# Patient Record
Sex: Male | Born: 1985 | Race: White | Hispanic: No | Marital: Married | Smoking: Never smoker
Health system: Southern US, Community
[De-identification: ages and names within clinical notes are randomized; demographics above are authoritative.]

## PROBLEM LIST (undated history)

## (undated) DIAGNOSIS — N200 Calculus of kidney: Secondary | ICD-10-CM

---

## 2005-06-28 ENCOUNTER — Emergency Department (HOSPITAL_COMMUNITY): Admission: EM | Admit: 2005-06-28 | Discharge: 2005-06-28 | Payer: Self-pay | Admitting: Emergency Medicine

## 2008-05-10 ENCOUNTER — Emergency Department (HOSPITAL_COMMUNITY): Admission: EM | Admit: 2008-05-10 | Discharge: 2008-05-10 | Payer: Self-pay | Admitting: Emergency Medicine

## 2021-11-29 ENCOUNTER — Emergency Department (HOSPITAL_COMMUNITY)
Admission: EM | Admit: 2021-11-29 | Discharge: 2021-11-29 | Disposition: A | Discharge status: Home / Self Care | Payer: Medicaid Other | Attending: Emergency Medicine | Admitting: Emergency Medicine

## 2021-11-29 ENCOUNTER — Other Ambulatory Visit: Payer: Self-pay

## 2021-11-29 ENCOUNTER — Encounter (HOSPITAL_COMMUNITY): Payer: Self-pay | Admitting: *Deleted

## 2021-11-29 DIAGNOSIS — H1031 Unspecified acute conjunctivitis, right eye: Secondary | ICD-10-CM | POA: Insufficient documentation

## 2021-11-29 DIAGNOSIS — H5789 Other specified disorders of eye and adnexa: Secondary | ICD-10-CM | POA: Diagnosis present

## 2021-11-29 MED ORDER — ERYTHROMYCIN 5 MG/GM OP OINT: 1.0000 "application " | TOPICAL_OINTMENT | Freq: Four times a day (QID) | OPHTHALMIC | 0 refills | Status: AC

## 2021-11-29 NOTE — ED Provider Notes (Signed)
?[facility]GENCY DEPT ?[facility] ?Provider Note ?MRN:  [id number]  ?Arrival date & time: 11/29/21    ? ?Chief Complaint   ?Eye Drainage ?  ?History of Present Illness   ?Roger Anderson is a 36 y.o. year-old male with no pertinent past medical presenting to the ED with chief complaint of eye drainage. ? ?Right eye redness, green drainage, mild irritation for the past few days.  No vision loss, no other complaints. ? ?Review of Systems  ?A thorough review of systems was obtained and all systems are negative except as noted in the HPI and PMH.  ? ?Patient's Health History   ?History reviewed. No pertinent past medical history.  ?History reviewed. No pertinent surgical history.  ?History reviewed. No pertinent family history.  ?Social History  ? ?Socioeconomic History  ? Marital status: Single  ?  Spouse name: Not on file  ? Number of children: Not on file  ? Years of education: Not on file  ? Highest education level: Not on file  ?Occupational History  ? Not on file  ?Tobacco Use  ? Smoking status: Never  ? Smokeless tobacco: Never  ?Substance and Sexual Activity  ? Alcohol use: Yes  ? Drug use: Not Currently  ? Sexual activity: Not on file  ?Other Topics Concern  ? Not on file  ?Social History Narrative  ? Not on file  ? ?Social Determinants of Health  ? ?Financial Resource Strain: Not on file  ?Food Insecurity: Not on file  ?Transportation Needs: Not on file  ?Physical Activity: Not on file  ?Stress: Not on file  ?Social Connections: Not on file  ?Intimate Partner Violence: Not on file  ?  ? ?Physical Exam  ? ?Vitals:  ? 11/29/21 2045  ?BP: 135/84  ?Pulse: 70  ?Resp: 18  ?Temp: 97.7 ?F (36.5 ?C)  ?SpO2: 99%  ?  ?CONSTITUTIONAL: Well-appearing, NAD ?NEURO/PSYCH:  Alert and oriented x 3, no focal deficits ?EYES:  eyes equal and reactive, normal extraocular movements, right eye with moderate conjunctival erythema ?ENT/NECK:  no LAD, no JVD ?CARDIO: Regular rate, well-perfused, normal S1 and  S2 ?PULM:  CTAB no wheezing or rhonchi ?GI/GU:  non-distended, non-tender ?MSK/SPINE:  No gross deformities, no edema ?SKIN:  no rash, atraumatic ? ? ?*Additional and/or pertinent findings included in MDM below ? ?Diagnostic and Interventional Summary  ? ? EKG Interpretation ? ?Date/Time:    ?Ventricular Rate:    ?PR Interval:    ?QRS Duration:   ?QT Interval:    ?QTC Calculation:   ?R Axis:     ?Text Interpretation:   ?  ? ?  ? ?Labs Reviewed - No data to display  ?No orders to display  ?  ?Medications - No data to display  ? ?Procedures  /  Critical Care ?Procedures ? ?ED Course and Medical Decision Making  ?Initial Impression and Ddx ?Suspect conjunctivitis, possibly bacterial given the yellow-green discharge.  No significant pain, highly doubt glaucoma or corneal abrasion or ulcer.  No recent trauma, no contact lens use.  Appropriate for discharge on erythromycin. ? ?Past medical/surgical history that increases complexity of ED encounter: None ? ?Interpretation of Diagnostics ?Not applicable ? ?Patient Reassessment and Ultimate Disposition/Management ?Discharge home ? ?Patient management required discussion with the following services or consulting groups:  None ? ?Complexity of Problems Addressed ?Acute illness or injury that poses threat of life of bodily function ? ?Additional Data Reviewed and Analyzed ?Further history obtained from: ?Further history from spouse/family member ? ?  Additional Factors Impacting ED Encounter Risk ?Prescriptions ? ?Elmer Sow. Pilar Plate, MD ?[facility] Emergency Medicine ?[facility] [email or web address] ? ?Final Clinical Impressions(s) / ED Diagnoses  ? ?  ICD-10-CM   ?1. Acute conjunctivitis of right eye, unspecified acute conjunctivitis type  H10.31   ?  ?  ?ED Discharge Orders   ? ?      Ordered  ?  erythromycin ophthalmic ointment  4 times daily       ? 11/29/21 2329  ? ?  ?  ? ?  ?  ? ?Discharge Instructions Discussed with and Provided to Patient:  ? ? ?Discharge  Instructions   ? ?  ?You were evaluated in the Emergency Department and after careful evaluation, we did not find any emergent condition requiring admission or further testing in the hospital. ? ?Your exam/testing today is overall reassuring.  Symptoms seem to be due to pinkeye or conjunctivitis.  Please take the erythromycin as directed.  If symptoms do not improve over the next week, recommend follow-up with an ophthalmologist. ? ?Please return to the Emergency Department if you experience any worsening of your condition.   Thank you for allowing [place] to be a part of your care. ? ? ? ?  ?Sabas Sous, MD ?11/29/21 2333 ? ?

## 2021-11-29 NOTE — ED Triage Notes (Signed)
Pt with redness to right eye with drainage that started this afternoon.  ?

## 2021-11-29 NOTE — Discharge Instructions (Signed)
You were evaluated in the Emergency Department and after careful evaluation, we did not find any emergent condition requiring admission or further testing in the hospital. ? ?Your exam/testing today is overall reassuring.  Symptoms seem to be due to pinkeye or conjunctivitis.  Please take the erythromycin as directed.  If symptoms do not improve over the next week, recommend follow-up with an ophthalmologist. ? ?Please return to the Emergency Department if you experience any worsening of your condition.   Thank you for allowing [place] to be a part of your care. ?

## 2022-01-08 ENCOUNTER — Emergency Department (HOSPITAL_COMMUNITY): Payer: Medicaid Other

## 2022-01-08 ENCOUNTER — Other Ambulatory Visit: Payer: Self-pay

## 2022-01-08 ENCOUNTER — Encounter (HOSPITAL_COMMUNITY): Payer: Self-pay

## 2022-01-08 ENCOUNTER — Emergency Department (HOSPITAL_COMMUNITY)
Admission: EM | Admit: 2022-01-08 | Discharge: 2022-01-08 | Disposition: A | Discharge status: Home / Self Care | Payer: Medicaid Other | Attending: Emergency Medicine | Admitting: Emergency Medicine

## 2022-01-08 DIAGNOSIS — W310XXA Contact with mining and earth-drilling machinery, initial encounter: Secondary | ICD-10-CM | POA: Insufficient documentation

## 2022-01-08 DIAGNOSIS — S65402A Unspecified injury of blood vessel of left thumb, initial encounter: Secondary | ICD-10-CM | POA: Diagnosis present

## 2022-01-08 DIAGNOSIS — S61012A Laceration without foreign body of left thumb without damage to nail, initial encounter: Secondary | ICD-10-CM | POA: Insufficient documentation

## 2022-01-08 DIAGNOSIS — Z23 Encounter for immunization: Secondary | ICD-10-CM | POA: Diagnosis not present

## 2022-01-08 MED ORDER — TETANUS-DIPHTH-ACELL PERTUSSIS 5-2.5-18.5 LF-MCG/0.5 IM SUSY
0.5000 mL | PREFILLED_SYRINGE | Freq: Once | INTRAMUSCULAR | Status: AC
  Administered 2022-01-08: 0.5 mL via INTRAMUSCULAR
  Filled 2022-01-08: qty 0.5

## 2022-01-08 MED ORDER — CEPHALEXIN 500 MG PO CAPS: 500.0000 mg | ORAL_CAPSULE | Freq: Four times a day (QID) | ORAL | 0 refills | Status: DC

## 2022-01-08 NOTE — ED Triage Notes (Signed)
Po vfrom home. Cc of left thumb lac 5 days ago from a box cutter. Says it hurts.  ?

## 2022-01-08 NOTE — Discharge Instructions (Signed)
Take Tylenol as you have been.  Take it in your milligrams of ibuprofen 3 times daily with food and water.  Take Keflex 4 times daily for the next 5 days to cover for any infection.  Your tetanus was updated today, this will be good for 10 years or 7 if you injure yourself again.  See your primary next week for wound recheck. ?

## 2022-01-08 NOTE — ED Provider Notes (Signed)
?[facility] EMERGENCY DEPARTMENT ?Provider Note ? ? ?CSN: [id number] ?Arrival date & time: 01/08/22  1752 ? ?  ? ?History ? ?Chief Complaint  ?Patient presents with  ? Laceration  ? ? ?Roger Anderson is a 36 y.o. male. ? ? ?Laceration ? ? Patient presents with laceration to left thumb that happened 5 days ago.  He was not seen at that time because he did not want to go to the hospital.  This tetanus is not up-to-date.  He has been having pain to the site of the laceration since the incident, has been getting worse with pressure and using his hand.  He is right-hand dominant. ? ?Home Medications ?Prior to Admission medications   ?Medication Sig Start Date End Date Taking? Authorizing Provider  ?cephALEXin (KEFLEX) 500 MG capsule Take 1 capsule (500 mg total) by mouth 4 (four) times daily. 01/08/22  Yes Theron Arista, PA-C  ?   ? ?Allergies    ?Patient has no known allergies.   ? ?Review of Systems   ?Review of Systems ? ?Physical Exam ?Updated Vital Signs ?BP 130/83   Pulse 68   Temp 97.7 ?F (36.5 ?C) (Oral)   Resp 15   Ht 5\' 10"  (1.778 m)   Wt 117 kg   SpO2 99%   BMI 37.01 kg/m?  ?Physical Exam ?Vitals and nursing note reviewed. Exam conducted with a chaperone present.  ?Constitutional:   ?   General: He is not in acute distress. ?   Appearance: Normal appearance.  ?HENT:  ?   Head: Normocephalic and atraumatic.  ?Eyes:  ?   General: No scleral icterus. ?   Extraocular Movements: Extraocular movements intact.  ?   Pupils: Pupils are equal, round, and reactive to light.  ?Cardiovascular:  ?   Pulses: Normal pulses.  ?Musculoskeletal:     ?   General: Normal range of motion.  ?   Comments: Thumb abduction adduction intact.  Able to make a fist.  ?Skin: ?   Capillary Refill: Capillary refill takes less than 2 seconds.  ?   Coloration: Skin is not jaundiced.  ?Neurological:  ?   Mental Status: He is alert. Mental status is at baseline.  ?   Coordination: Coordination normal.  ? ? ? ?ED Results / Procedures /  Treatments   ?Labs ?(all labs ordered are listed, but only abnormal results are displayed) ?Labs Reviewed - No data to display ? ?EKG ?None ? ?Radiology ?DG Finger Thumb Left ? ?Result Date: 01/08/2022 ?CLINICAL DATA:  Left thumb pain EXAM: LEFT THUMB 2+V COMPARISON:  None Available. FINDINGS: There is no evidence of fracture or dislocation. There is no evidence of arthropathy or other focal bone abnormality. Soft tissues are unremarkable. IMPRESSION: Negative. Electronically Signed   By: 03/10/2022 M.D.   On: 01/08/2022 21:19   ? ?Procedures ?Procedures  ? ? ?Medications Ordered in ED ?Medications  ?Tdap (BOOSTRIX) injection 0.5 mL (0.5 mLs Intramuscular Given 01/08/22 2142)  ? ? ?ED Course/ Medical Decision Making/ A&P ?  ?                        ?Medical Decision Making ?Amount and/or Complexity of Data Reviewed ?Radiology: ordered. ? ?Risk ?Prescription drug management. ? ? ?Patient presents due to laceration to left thumb.  He is neurovascular intact with brisk cap refill, radial pulse 2+ and full ROM to the thumb.  No appreciable fluctuance that would be amenable to I&D.  X-ray  obtained to evaluate for foreign bodies given mechanism, no foreign bodies noted.  Given contamination I do have some concern for developing infection, will cover with Keflex.  Tetanus was updated today as well.  Discussed with patient that when she long to close the sutures they verbalized understanding.  Patient was discharged in stable condition.  He will follow-up with his primary next week for wound recheck. ? ? ? ? ? ? ? ?Final Clinical Impression(s) / ED Diagnoses ?Final diagnoses:  ?Laceration of left thumb without foreign body without damage to nail, initial encounter  ? ? ?Rx / DC Orders ?ED Discharge Orders   ? ?      Ordered  ?  cephALEXin (KEFLEX) 500 MG capsule  4 times daily       ? 01/08/22 2204  ? ?  ?  ? ?  ? ? ?  ?Theron Arista, PA-C ?01/08/22 2207 ? ?  ?Bethann Berkshire, MD ?01/09/22 1135 ? ?

## 2022-04-07 ENCOUNTER — Emergency Department (HOSPITAL_BASED_OUTPATIENT_CLINIC_OR_DEPARTMENT_OTHER)
Admission: EM | Admit: 2022-04-07 | Discharge: 2022-04-07 | Disposition: A | Discharge status: Home / Self Care | Payer: Medicaid Other | Attending: Emergency Medicine | Admitting: Emergency Medicine

## 2022-04-07 ENCOUNTER — Other Ambulatory Visit: Payer: Self-pay

## 2022-04-07 DIAGNOSIS — W278XXA Contact with other nonpowered hand tool, initial encounter: Secondary | ICD-10-CM | POA: Diagnosis not present

## 2022-04-07 DIAGNOSIS — S59912A Unspecified injury of left forearm, initial encounter: Secondary | ICD-10-CM | POA: Diagnosis present

## 2022-04-07 DIAGNOSIS — S51812A Laceration without foreign body of left forearm, initial encounter: Secondary | ICD-10-CM | POA: Diagnosis not present

## 2022-04-07 DIAGNOSIS — Y990 Civilian activity done for income or pay: Secondary | ICD-10-CM | POA: Insufficient documentation

## 2022-04-07 MED ORDER — CEPHALEXIN 500 MG PO CAPS: 500.0000 mg | ORAL_CAPSULE | Freq: Three times a day (TID) | ORAL | 0 refills | Status: AC

## 2022-04-07 MED ORDER — LIDOCAINE HCL (PF) 1 % IJ SOLN
5.0000 mL | Freq: Once | INTRAMUSCULAR | Status: AC
  Administered 2022-04-07: 5 mL

## 2022-04-07 MED ORDER — LIDOCAINE HCL (PF) 1 % IJ SOLN
INTRAMUSCULAR | Status: AC
  Filled 2022-04-07: qty 5

## 2022-04-07 NOTE — Discharge Instructions (Signed)
You were evaluated in the Emergency Department and after careful evaluation, we did not find any emergent condition requiring admission or further testing in the hospital.  Your exam/testing today was overall reassuring.  We repaired your laceration with a combination of sutures and staples.  These will need to be removed in 10 to 14 days by healthcare professional.  Please return to the Emergency Department if you experience any worsening of your condition.  Thank you for allowing [place] to be a part of your care.

## 2022-04-07 NOTE — ED Provider Notes (Signed)
[facility] EMERGENCY [facility] Emergency Department Provider Note MRN:  [id number]  Arrival date & time: 04/07/22     Chief Complaint   Extremity Laceration   History of Present Illness   Roger Anderson is a 36 y.o. year-old male with no pertinent past medical history presenting to the ED with chief complaint of extremity laceration.  Patient works at a [employer] and was cutting paper with a box cutter and the knife slipped.  Sustained laceration to the left anterior forearm.  Some active bleeding.  No other injuries.  Tetanus up-to-date.  Review of Systems  A thorough review of systems was obtained and all systems are negative except as noted in the HPI and PMH.   Patient's Health History   No past medical history on file.  No past surgical history on file.  No family history on file.  Social History   Socioeconomic History   Marital status: Single    Spouse name: Not on file   Number of children: Not on file   Years of education: Not on file   Highest education level: Not on file  Occupational History   Not on file  Tobacco Use   Smoking status: Never   Smokeless tobacco: Never  Substance and Sexual Activity   Alcohol use: Yes   Drug use: Not Currently   Sexual activity: Not on file  Other Topics Concern   Not on file  Social History Narrative   Not on file   Social Determinants of Health   Financial Resource Strain: Not on file  Food Insecurity: Not on file  Transportation Needs: Not on file  Physical Activity: Not on file  Stress: Not on file  Social Connections: Not on file  Intimate Partner Violence: Not on file     Physical Exam   Vitals:   04/07/22 0625 04/07/22 0630  BP: 125/77   Pulse: 80   Resp: 18   Temp:  97.6 F (36.4 C)  SpO2: 100%     CONSTITUTIONAL: Well-appearing, NAD NEURO/PSYCH:  Alert and oriented x 3, no focal deficits EYES:  eyes equal and reactive ENT/NECK:  no LAD, no JVD CARDIO: Regular rate, well-perfused,  normal S1 and S2 PULM:  CTAB no wheezing or rhonchi GI/GU:  non-distended, non-tender MSK/SPINE:  No gross deformities, no edema SKIN: Linear 12 cm laceration to the left anterior forearm, active bleeding.   *Additional and/or pertinent findings included in MDM below  Diagnostic and Interventional Summary    EKG Interpretation  Date/Time:    Ventricular Rate:    PR Interval:    QRS Duration:   QT Interval:    QTC Calculation:   R Axis:     Text Interpretation:         Labs Reviewed - No data to display  No orders to display    Medications  lidocaine (PF) (XYLOCAINE) 1 % injection (has no administration in time range)  lidocaine (PF) (XYLOCAINE) 1 % injection 5 mL (5 mLs Infiltration Given 04/07/22 0629)     Procedures  /  Critical Care .Marland KitchenLaceration Repair  Date/Time: 04/07/2022 7:00 AM  Performed by: Sabas Sous, MD Authorized by: Sabas Sous, MD   Consent:    Consent obtained:  Verbal   Consent given by:  Patient   Risks, benefits, and alternatives were discussed: yes     Risks discussed:  Infection, need for additional repair, nerve damage, poor wound healing, poor cosmetic result, pain, retained foreign body, tendon damage  and vascular damage Universal protocol:    Procedure explained and questions answered to patient or proxy's satisfaction: yes     Immediately prior to procedure, a time out was called: yes     Patient identity confirmed:  Verbally with patient Anesthesia:    Anesthesia method:  Local infiltration   Local anesthetic:  Lidocaine 1% w/o epi Laceration details:    Location:  Shoulder/arm   Shoulder/arm location:  L lower arm   Length (cm):  12   Depth (mm):  15 Pre-procedure details:    Preparation:  Patient was prepped and draped in usual sterile fashion Exploration:    Limited defect created (wound extended): no     Hemostasis achieved with:  Tied off vessels   Wound exploration: wound explored through full range of motion and  entire depth of wound visualized     Wound extent: areolar tissue violated and fascia violated     Wound extent: no nerve damage noted, no tendon damage noted and no vascular damage noted     Contaminated: no   Treatment:    Area cleansed with:  Saline   Amount of cleaning:  Extensive   Irrigation solution:  Sterile saline   Debridement:  None   Undermining:  None   Layers/structures repaired:  Deep subcutaneous Deep subcutaneous:    Suture size:  5-0   Wound subcutaneous closure material used: Fast-absorbing Vicryl.   Suture technique:  Figure eight   Number of sutures:  2 Skin repair:    Repair method:  Sutures and staples   Suture size:  3-0   Suture material:  Prolene   Suture technique:  Horizontal mattress and simple interrupted   Number of sutures:  4   Number of staples:  8 Approximation:    Approximation:  Close Repair type:    Repair type:  Intermediate Post-procedure details:    Dressing:  Open (no dressing)   Procedure completion:  Tolerated well, no immediate complications Comments:     Large linear laceration.  2 small arterial vessels located with active pulsatile bleeding.  Hemostasis achieved with 2 figure-of-eight sutures.  Thoroughly irrigated with saline.  Seems to be violation of the fascia but minimal damage seen to the musculature.  Distally patient has completely normal strength, sensation, function, cap refill.  Wound approximated with central horizontal mattress.  Further approximated with 3 more simple interrupted sutures.  Skin then fully closed with staples.   ED Course and Medical Decision Making  Initial Impression and Ddx Forearm laceration, some active bleeding due to small arterial vessels, hemostasis achieved, laceration repaired as described above.  Wound was thoroughly evaluated and rinsed, no foreign body, no indication for imaging.  Extremity neurovascularly intact distally, nothing to suggest significant vascular injury.  Appropriate for  discharge.  Past medical/surgical history that increases complexity of ED encounter: None  Interpretation of Diagnostics Laboratory and/or imaging options to aid in the diagnosis/care of the patient were considered.  After careful history and physical examination, it was determined that there was no indication for diagnostics at this time.  Patient Reassessment and Ultimate Disposition/Management     Discharge  Patient management required discussion with the following services or consulting groups:  None  Complexity of Problems Addressed Acute illness or injury that poses threat of life of bodily function  Additional Data Reviewed and Analyzed Further history obtained from: Further history from spouse/family member  Additional Factors Impacting ED Encounter Risk Prescriptions and Minor Procedures  Elmer Sow. Pilar Plate, MD  [facility] Emergency Medicine [facility] [email or web address]  Final Clinical Impressions(s) / ED Diagnoses     ICD-10-CM   1. Laceration of left forearm, initial encounter  S51.812A       ED Discharge Orders          Ordered    cephALEXin (KEFLEX) 500 MG capsule  3 times daily        04/07/22 0654             Discharge Instructions Discussed with and Provided to Patient:    Discharge Instructions      You were evaluated in the Emergency Department and after careful evaluation, we did not find any emergent condition requiring admission or further testing in the hospital.  Your exam/testing today was overall reassuring.  We repaired your laceration with a combination of sutures and staples.  These will need to be removed in 10 to 14 days by healthcare professional.  Please return to the Emergency Department if you experience any worsening of your condition.  Thank you for allowing [place] to be a part of your care.       Sabas Sous, MD 04/07/22 [phone]

## 2022-04-07 NOTE — ED Triage Notes (Signed)
POV, alert and oriented x 4, ambulatory to room  Pt sts that he was using box cutter and it slipped. Laceration to left forearm, approx 5 inchs in length. Still oozing blood. TDAP UTD.

## 2023-01-29 ENCOUNTER — Ambulatory Visit (INDEPENDENT_AMBULATORY_CARE_PROVIDER_SITE_OTHER): Payer: Medicaid Other | Admitting: Urology

## 2023-01-29 ENCOUNTER — Encounter: Payer: Self-pay | Admitting: Urology

## 2023-01-29 VITALS — BP 119/79 | HR 67

## 2023-01-29 DIAGNOSIS — Z3009 Encounter for other general counseling and advice on contraception: Secondary | ICD-10-CM | POA: Diagnosis not present

## 2023-01-29 MED ORDER — DIAZEPAM 10 MG PO TABS: 10.0000 mg | ORAL_TABLET | Freq: Once | ORAL | 0 refills | Status: AC

## 2023-01-29 NOTE — Progress Notes (Signed)
   01/29/2023 9:43 AM   Roger Anderson 09/03/1985 [id number]  Referring provider: Lovey Newcomer, PA [address] [place],  [place] [address]  Desires sterilization   HPI: Roger Anderson is a 36yo here for evaluation for vasectomy. He has 5 children and his partner is prgnant with their 6th child. No scrotal surgeries.    PMH: No past medical history on file.  Surgical History: No past surgical history on file.  Home Medications:  Allergies as of 01/29/2023   No Known Allergies      Medication List    as of Jan 29, 2023  9:43 AM   You have not been prescribed any medications.     Allergies: No Known Allergies  Family History: No family history on file.  Social History:  reports that he has never smoked. He has never used smokeless tobacco. He reports current alcohol use. He reports that he does not currently use drugs.  ROS: All other review of systems were reviewed and are negative except what is noted above in HPI  Physical Exam: BP 119/79   Pulse 67   Constitutional:  Alert and oriented, No acute distress. HEENT: [place] AT, moist mucus membranes.  Trachea midline, no masses. Cardiovascular: No clubbing, cyanosis, or edema. Respiratory: Normal respiratory effort, no increased work of breathing. GI: Abdomen is soft, nontender, nondistended, no abdominal masses GU: No CVA tenderness. Circumcised phallus. No masses/lesions on penis, testis, scrotum. Bilaterla vas deferens palpable Lymph: No cervical or inguinal lymphadenopathy. Skin: No rashes, bruises or suspicious lesions. Neurologic: Grossly intact, no focal deficits, moving all 4 extremities. Psychiatric: Normal mood and affect.  Laboratory Data: No results found for: "WBC", "HGB", "HCT", "MCV", "PLT"  No results found for: "CREATININE"  No results found for: "PSA"  No results found for: "TESTOSTERONE"  No results found for: "HGBA1C"  Urinalysis No results found for: "COLORURINE", "APPEARANCEUR",  "LABSPEC", "PHURINE", "GLUCOSEU", "HGBUR", "BILIRUBINUR", "KETONESUR", "PROTEINUR", "UROBILINOGEN", "NITRITE", "LEUKOCYTESUR"  No results found for: "LABMICR", "WBCUA", "RBCUA", "LABEPIT", "MUCUS", "BACTERIA"  Pertinent Imaging:  No results found for this or any previous visit.  No results found for this or any previous visit.  No results found for this or any previous visit.  No results found for this or any previous visit.  No results found for this or any previous visit.  No valid procedures specified. No results found for this or any previous visit.  No results found for this or any previous visit.   Assessment & Plan:    1. Vasectomy evaluation Schedule for vasectomy Rx for valium sent to pharmacy   No follow-ups on file.  Wilkie Aye, MD  [facility] [place]

## 2023-01-29 NOTE — Patient Instructions (Signed)
Vasectomy Vasectomy is a procedure in which the vas deferens is cut and then tied or burned (cauterized). The vas deferens is a tube that carries sperm from the testicle to the part of the body that drains urine from the bladder (urethra). This procedure blocks sperm from going through the vas deferens and penis during ejaculation. This ensures that sperm does not go into the vagina during sex. Vasectomy does not affect sexual desire or performance and does not prevent sexually transmitted infections. Vasectomy is considered a permanent and very effective form of birth control (contraception). The decision to have a vasectomy should not be made during a stressful time, such as after the loss of a pregnancy or a divorce. You and your partner should decide on whether to have a vasectomy when you are sure that you do not want children in the future. Tell a health care provider about: Any allergies you have. All medicines you are taking, including vitamins, herbs, eye drops, creams, and over-the-counter medicines. Any problems you or family members have had with anesthetic medicines. Any blood disorders you have. Any surgeries you have had. Any medical conditions you have. What are the risks? Generally, this is a safe procedure. However, problems may occur, including: Infection. Bleeding and swelling of the scrotum. The scrotum is the sac that contains the testicles, blood vessels, and structures that help deliver sperm and semen. Allergic reactions to medicines. Failure of the procedure to prevent pregnancy. There is a very small chance that the tied or cauterized ends of the vas deferens may reconnect (recanalization). If this happens, you could still make a woman pregnant. Pain in the scrotum that continues after you heal from the procedure. What happens before the procedure? Medicines Ask your health care provider about: Changing or stopping your regular medicines. This is especially important if  you are taking diabetes medicines or blood thinners. Taking medicines such as aspirin and ibuprofen. These medicines can thin your blood. Do not take these medicines unless your health care provider tells you to take them. Taking over-the-counter medicines, vitamins, herbs, and supplements. You may be told to take a medicine to help you relax (sedative) a few hours before the procedure. General instructions Do not use any products that contain nicotine or tobacco for at least 4 weeks before the procedure. These products include cigarettes, e-cigarettes, and chewing tobacco. If you need help quitting, ask your health care provider. Plan to have a responsible adult take you home from the hospital or clinic. If you will be going home right after the procedure, plan to have a responsible adult care for you for the time you are told. This is important. Ask your health care provider: How your surgery site will be marked. What steps will be taken to help prevent infection. These steps may include: Removing hair at the surgery site. Washing skin with a germ-killing soap. Taking antibiotic medicine. What happens during the procedure?  You will be given one or more of the following: A sedative, unless you were told to take this a few hours before the procedure. A medicine to numb the area (local anesthetic). Your health care provider will feel, or palpate, for your vas deferens. To reach the vas deferens, one of two methods may be used: A very small incision may be made in your scrotum. A punctured opening may be made in your scrotum, without an incision. Your vas deferens will be pulled out of your scrotum and cut. Then, the vas deferens will be closed  in one of two ways: Tied at the ends. Cauterized at the ends to seal them off. The vas deferens will be put back into your scrotum. The incision or puncture opening will be closed with absorbable stitches (sutures). The sutures will eventually  dissolve and will not need to be removed after the procedure. The procedure will be repeated on the other side of your scrotum. The procedure may vary among health care providers and hospitals. What happens after the procedure? You will be monitored to make sure that you do not have problems. You will be asked not to ejaculate for at least 1 week after the procedure, or for as long as you are told. You will need to use a different form of contraception for 2-4 months after the procedure, until you have test results confirming that there are no sperm in your semen. You may be given scrotal support to wear, such as a jockstrap or underwear with a supportive pouch. If you were given a sedative during the procedure, it can affect you for several hours. Do not drive or operate machinery until your health care provider says that it is safe. Summary Vasectomy blocks sperm from being released during ejaculation. This procedure is considered a permanent and very effective form of birth control. Your scrotum will be numbed with medicine (local anesthetic) for the procedure. After the procedure, you will be asked not to ejaculate for at least 1 week, or for as long as you are told. You will also need to use a different form of contraception until your test results confirm that there are no sperm in your semen. This information is not intended to replace advice given to you by your health care provider. Make sure you discuss any questions you have with your health care provider. Document Revised: 01/04/2020 Document Reviewed: 01/04/2020 Elsevier Patient Education  2024 [employer].

## 2023-02-09 ENCOUNTER — Other Ambulatory Visit: Payer: Self-pay | Admitting: Physician Assistant

## 2023-02-09 DIAGNOSIS — R569 Unspecified convulsions: Secondary | ICD-10-CM

## 2023-02-09 DIAGNOSIS — R55 Syncope and collapse: Secondary | ICD-10-CM

## 2023-02-09 DIAGNOSIS — H53129 Transient visual loss, unspecified eye: Secondary | ICD-10-CM

## 2023-03-17 ENCOUNTER — Ambulatory Visit (INDEPENDENT_AMBULATORY_CARE_PROVIDER_SITE_OTHER): Payer: Medicaid Other | Admitting: Urology

## 2023-03-17 VITALS — BP 127/84 | HR 85

## 2023-03-17 DIAGNOSIS — Z302 Encounter for sterilization: Secondary | ICD-10-CM | POA: Diagnosis not present

## 2023-03-17 DIAGNOSIS — Z9852 Vasectomy status: Secondary | ICD-10-CM

## 2023-03-17 NOTE — Patient Instructions (Signed)
Vasectomy Postoperative Instructions ? ?Please bring back a semen analysis in approximately 3 months.  ?Your semen analysis  will need to be taken to  ?[facility]  [address], [place], [place] [address] ?[phone] ? ? You will be given a sterile specimen cup. Please label the cup with your name, date of birth, date and time of collections.  ?What to Expect ? - slight redness, swelling and scant drainage along the incision ? - mild to moderate discomfort ? - black and blue (bruising) as the tissue heals ? - low grade fever ? - scrotal sensitivity and/or tenderness ?- Edges of the incision may pull apart and heal slowly, sometimes a knot may be present which remains for several months.  This is NORMAL and all part of the healing process. ?- if stitches are placed, they do not need to be removed ?- if you have pain or discomfort immediately after the vasectomy, you may use OTC pain medication for relief , ex: tylenol.  After local anesthetic wears off an ice pack will provide additional comfort and can also prevent swelling if used ? ?Activity ? - no sexual intercourse for at lease 5 days depending on comfort ? - no heavy lifting for 48-72 hours (anything over 5-10 lbs) ? ?Wound Care ? - shower only after 24 hours ? - no tub baths, hot tub, or pools for at least 7 days ? - ice packs for 48 hours: 30 minutes on and 30 minutes off ? ?Problem to Report ? - generalized redness ? - increased pain and swelling ? - fever greater than 101 F ? - significant drainage or bleeding from the wound ? ?TO DO ?- Ejaculations help to clear the passage of sperm, but you must use another from of birth control until you are told you may discontinue its use!! ?- You will be given a specimen cup to bring back a semen sample in 3 months to check and see if its clear of sperm.  Only after the semen is sent for analysis and is reported back as clear should you use this as your primary form of birth control!    ?

## 2023-03-23 NOTE — Progress Notes (Signed)
03/17/2023  CC: desires sterilization   HPI: Roger Anderson is a 36yo here for vasectomy  Blood pressure 127/84, pulse 85. NED. A&Ox3.   No respiratory distress   Abd soft, NT, ND Normal external genitalia with patent urethral meatus  A timeout was performed.  Patient's identity and consent was confirmed.  All questions were answered.   Bilateral Vasectomy Procedure  Pre-Procedure: - Patient's scrotum was prepped and draped for vasectomy. - The vas was palpated through the scrotal skin on the left. - 1% Xylocaine was injected into the skin and surrounding tissue for placement  - In a similar manner, the vas on the right was identified, anesthetized, and stabilized.  Procedure: - A sharp hemostat was used to make a small stab incision in the skin overlying the vas - The left vas was isolated and brought up through the incision exposing that structure. - Bleeding points were cauterized as they occurred. - The vas was free from the surrounding structures and brought to the view. - A segment was positioned for placement with a hemostat. - A second hemostat was placed and a small segment between the two hemostats and was removed for inspection. - Each end of the transected vas lumen was fulgurated/ obliterated using needlepoint electrocautery -A fascial interposition was performed on testicular end of the vas using #3-0 chromic suture -The same procedure was performed on the right. - A single suture of #3-0 chromic catgut was used to close each lateral scrotal skin incision - A dressing was applied.  Post-Procedure: - Patient was instructed in care of the operative area - A specimen is to be delivered in 12 weeks   -Another form of contraception is to be used until post vasectomy semen analysis  Wilkie Aye, MD

## 2023-03-25 ENCOUNTER — Other Ambulatory Visit: Payer: Medicaid Other

## 2023-05-14 ENCOUNTER — Ambulatory Visit
Admission: RE | Admit: 2023-05-14 | Discharge: 2023-05-14 | Disposition: A | Discharge status: Home / Self Care | Payer: Medicaid Other | Source: Ambulatory Visit | Attending: Physician Assistant | Admitting: Physician Assistant

## 2023-05-14 DIAGNOSIS — R569 Unspecified convulsions: Secondary | ICD-10-CM

## 2023-05-14 DIAGNOSIS — H53129 Transient visual loss, unspecified eye: Secondary | ICD-10-CM

## 2023-05-14 DIAGNOSIS — R55 Syncope and collapse: Secondary | ICD-10-CM

## 2023-05-14 MED ORDER — GADOPICLENOL 0.5 MMOL/ML IV SOLN
10.0000 mL | Freq: Once | INTRAVENOUS | Status: AC | PRN
  Administered 2023-05-14: 10 mL via INTRAVENOUS

## 2023-08-03 IMAGING — DX DG FINGER THUMB 2+V*L*
3 series · 3 of 3 positions shown · non-contrast
Comparison: None Available.

CLINICAL DATA: Left thumb pain

EXAM:
LEFT THUMB 2+V

[finger ap]
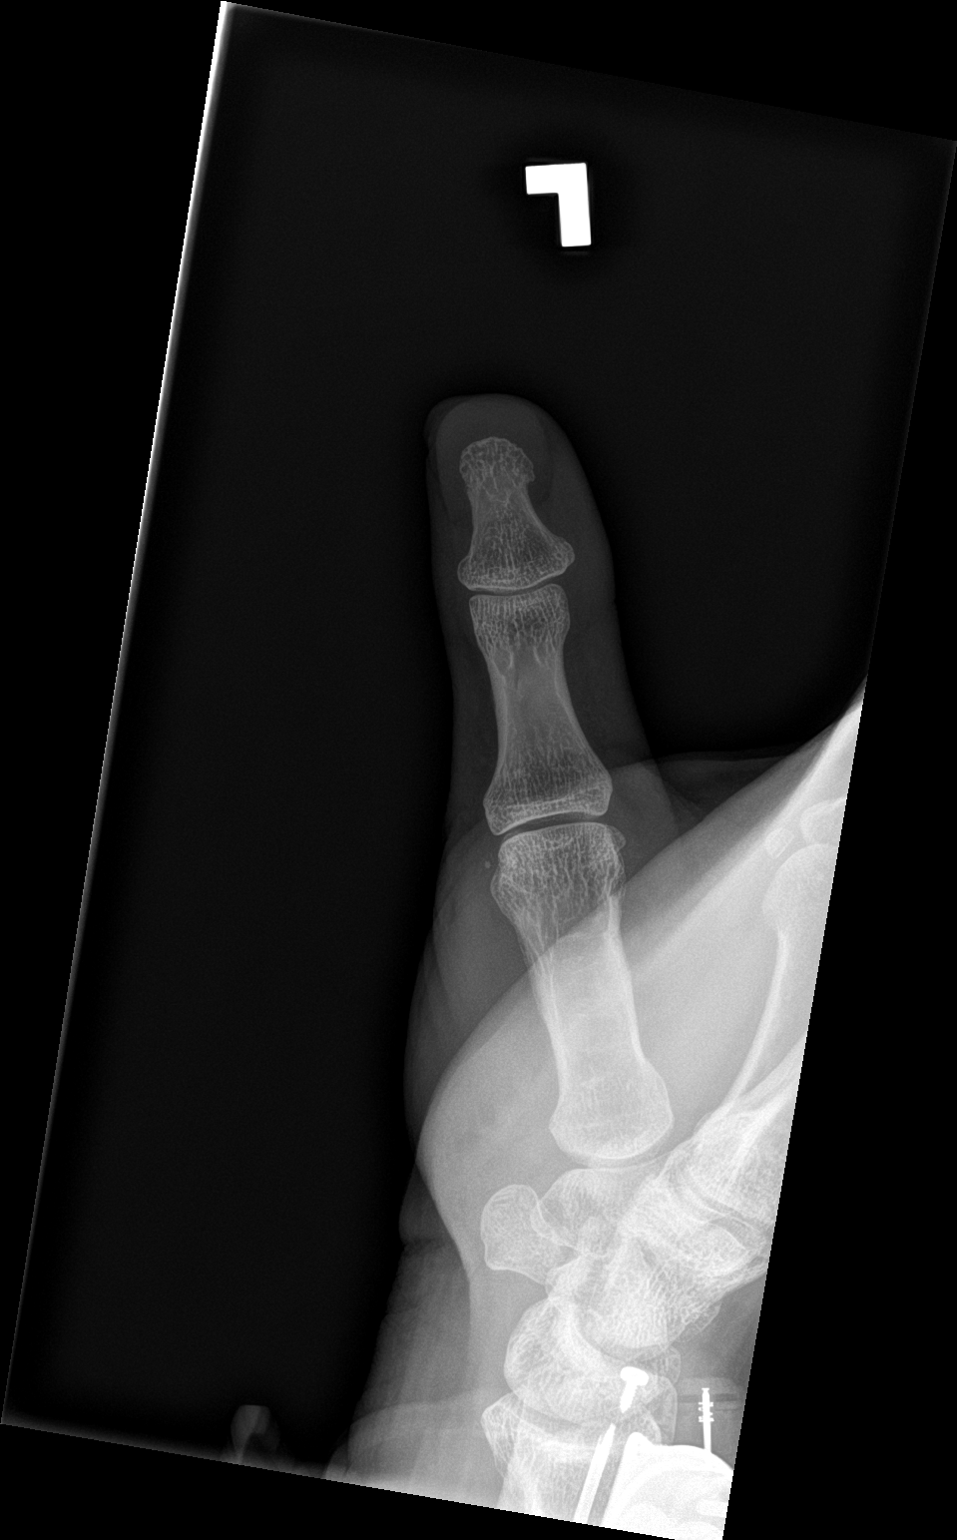

[finger obl]
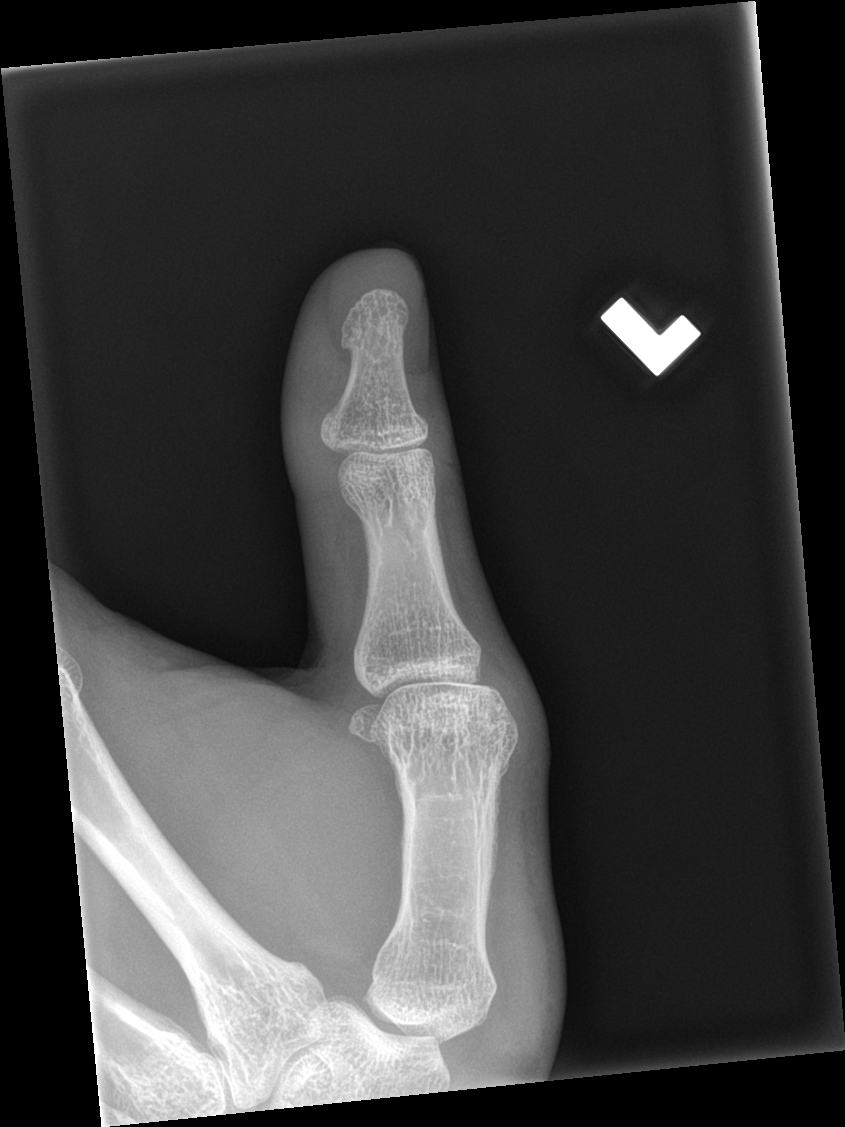

[finger lat]
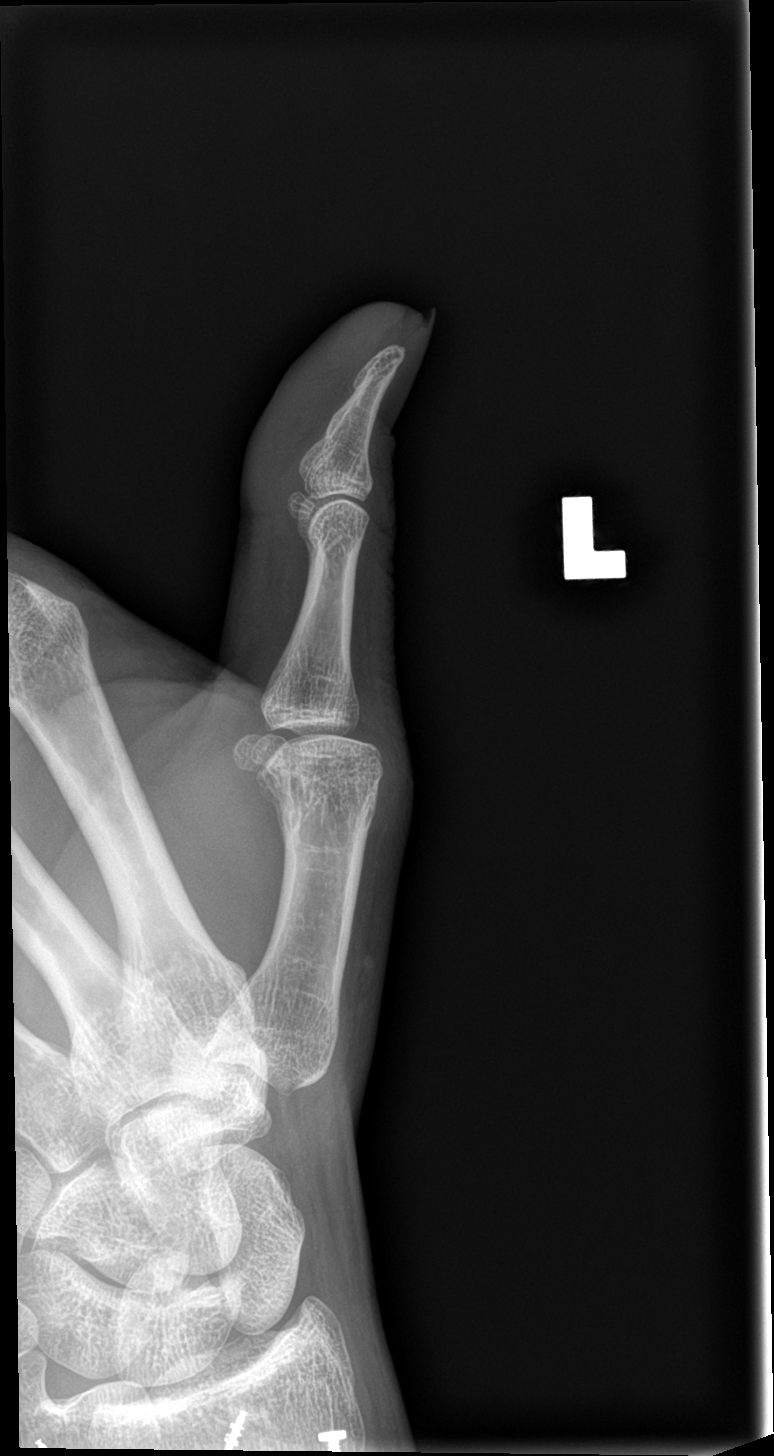

[3 of 3 positions shown; findings below may reference images not displayed]

FINDINGS: There is no evidence of fracture or dislocation. There is no
evidence of arthropathy or other focal bone abnormality. Soft
tissues are unremarkable.
IMPRESSION: Negative.

## 2023-09-07 ENCOUNTER — Encounter (HOSPITAL_COMMUNITY): Payer: Self-pay

## 2023-09-07 ENCOUNTER — Emergency Department (HOSPITAL_COMMUNITY): Payer: Medicaid Other

## 2023-09-07 ENCOUNTER — Emergency Department (HOSPITAL_COMMUNITY)
Admission: EM | Admit: 2023-09-07 | Discharge: 2023-09-07 | Disposition: A | Discharge status: Home / Self Care | Payer: Medicaid Other | Attending: Emergency Medicine | Admitting: Emergency Medicine

## 2023-09-07 DIAGNOSIS — R109 Unspecified abdominal pain: Secondary | ICD-10-CM | POA: Diagnosis present

## 2023-09-07 DIAGNOSIS — N200 Calculus of kidney: Secondary | ICD-10-CM

## 2023-09-07 DIAGNOSIS — N132 Hydronephrosis with renal and ureteral calculous obstruction: Secondary | ICD-10-CM | POA: Insufficient documentation

## 2023-09-07 LAB — COMPREHENSIVE METABOLIC PANEL
ALT: 19 U/L (ref 0–44)
AST: 23 U/L (ref 15–41)
Albumin: 4.7 g/dL (ref 3.5–5.0)
Alkaline Phosphatase: 75 U/L (ref 38–126)
Anion gap: 7 (ref 5–15)
BUN: 21 mg/dL — ABNORMAL HIGH (ref 6–20)
CO2: 27 mmol/L (ref 22–32)
Calcium: 10.1 mg/dL (ref 8.9–10.3)
Chloride: 105 mmol/L (ref 98–111)
Creatinine, Ser: 0.89 mg/dL (ref 0.61–1.24)
GFR, Estimated: >60 mL/min (ref >60)
Glucose, Bld: 129 mg/dL — ABNORMAL HIGH (ref 70–99)
Potassium: 4 mmol/L (ref 3.5–5.1)
Sodium: 139 mmol/L (ref 135–145)
Total Bilirubin: 0.8 mg/dL (ref 0.0–1.2)
Total Protein: 7.5 g/dL (ref 6.5–8.1)

## 2023-09-07 LAB — URINALYSIS, ROUTINE W REFLEX MICROSCOPIC
Bilirubin Urine: NEGATIVE
Glucose, UA: NEGATIVE mg/dL
Ketones, ur: NEGATIVE mg/dL
Leukocytes,Ua: NEGATIVE
Nitrite: NEGATIVE
Protein, ur: 30 mg/dL — AB
RBC / HPF: >50 RBC/hpf (ref 0–5)
Specific Gravity, Urine: 1.02 (ref 1.005–1.030)
pH: 7 (ref 5.0–8.0)

## 2023-09-07 LAB — CBC
HCT: 39.3 % (ref 39.0–52.0)
Hemoglobin: 13.3 g/dL (ref 13.0–17.0)
MCH: 30.6 pg (ref 26.0–34.0)
MCHC: 33.8 g/dL (ref 30.0–36.0)
MCV: 90.3 fL (ref 80.0–100.0)
Platelets: 118 10*3/uL — ABNORMAL LOW (ref 150–400)
RBC: 4.35 MIL/uL (ref 4.22–5.81)
RDW: 12.5 % (ref 11.5–15.5)
WBC: 6.8 10*3/uL (ref 4.0–10.5)
nRBC: 0 % (ref 0.0–0.2)

## 2023-09-07 LAB — LIPASE, BLOOD: Lipase: 24 U/L (ref 11–51)

## 2023-09-07 MED ORDER — TAMSULOSIN HCL 0.4 MG PO CAPS: 0.4000 mg | ORAL_CAPSULE | Freq: Every day | ORAL | 0 refills | Status: DC

## 2023-09-07 MED ORDER — OXYCODONE HCL 5 MG PO TABS: 5.0000 mg | ORAL_TABLET | ORAL | 0 refills | Status: DC | PRN

## 2023-09-07 MED ORDER — KETOROLAC TROMETHAMINE 15 MG/ML IJ SOLN
15.0000 mg | Freq: Once | INTRAMUSCULAR | Status: AC
Start: 2023-09-07 — End: 2023-09-07
  Administered 2023-09-07: 15 mg via INTRAVENOUS
  Filled 2023-09-07: qty 1

## 2023-09-07 NOTE — Discharge Instructions (Addendum)
 You were seen for your kidney stone in the emergency department.    At home, please take Tylenol and ibuprofen for your pain. You may also take the oxycodone we have prescribed you for any breakthrough pain that may have.  Do not take this before driving or operating heavy machinery.  Do not take this medication with alcohol.  Use the flowmax we have give you every day until the kidney stone passes. Please also strain your urine to collect the kidney stone. Store it in a container and take it to your urologist for analysis.   Follow-up with urology in a week to discuss your symptoms.   Return immediately to the emergency department if you experience any of the following: fever, unbearable pain, urinary retention, or any other concerning symptoms.    Thank you for visiting our Emergency Department. It was a pleasure taking care of you today.

## 2023-09-07 NOTE — ED Triage Notes (Signed)
 Pt c/o intermittent sharp L sided abdominal pain starting this afternoon.  Pain score 9/10.  Denies n/v/d and GU complaints.

## 2023-09-07 NOTE — ED Provider Notes (Signed)
 [place] EMERGENCY DEPARTMENT AT [facility] Provider Note   CSN: [id number] Arrival date & time: 09/07/23  1703     History  Chief Complaint  Patient presents with   Abdominal Pain    Roger Anderson is a 38 y.o. male.  38 year old male previously healthy presents emergency department left flank pain.  Since this morning has been having intermittent sharp left-sided flank pain.  Radiates to his left anterior abdomen.  Currently 8/10 in severity.  No exacerbating or alleviating factors.  Denies any fevers, chills, nausea, vomiting, diarrhea, dysuria or frequency.  No hematuria.  No history of kidney stones or diverticulitis.  No testicular pain or swelling.       Home Medications Prior to Admission medications   Medication Sig Start Date End Date Taking? Authorizing Provider  oxyCODONE  (ROXICODONE ) 5 MG immediate release tablet Take 1 tablet (5 mg total) by mouth every 4 (four) hours as needed for severe pain (pain score 7-10). 09/07/23  Yes Yolande Lamar BROCKS, MD  tamsulosin  (FLOMAX ) 0.4 MG CAPS capsule Take 1 capsule (0.4 mg total) by mouth daily after breakfast. 09/07/23  Yes Yolande Lamar BROCKS, MD      Allergies    Patient has no known allergies.    Review of Systems   Review of Systems  Physical Exam Updated Vital Signs BP (!) 117/90   Pulse 99   Temp 98 F (36.7 C) (Oral)   Resp 17   Ht 5' 10 (1.778 m)   Wt 115.7 kg   SpO2 99%   BMI 36.59 kg/m  Physical Exam Vitals and nursing note reviewed.  Constitutional:      General: He is not in acute distress.    Appearance: He is well-developed.  HENT:     Head: Normocephalic and atraumatic.     Right Ear: External ear normal.     Left Ear: External ear normal.     Nose: Nose normal.  Eyes:     Extraocular Movements: Extraocular movements intact.     Conjunctiva/sclera: Conjunctivae normal.     Pupils: Pupils are equal, round, and reactive to light.  Pulmonary:     Effort: Pulmonary effort is normal.  No respiratory distress.  Abdominal:     General: There is no distension.     Palpations: Abdomen is soft. There is no mass.     Tenderness: There is no abdominal tenderness. There is no right CVA tenderness, left CVA tenderness or guarding.  Musculoskeletal:     Cervical back: Normal range of motion and neck supple.  Skin:    General: Skin is warm and dry.  Neurological:     Mental Status: He is alert. Mental status is at baseline.  Psychiatric:        Mood and Affect: Mood normal.        Behavior: Behavior normal.     ED Results / Procedures / Treatments   Labs (all labs ordered are listed, but only abnormal results are displayed) Labs Reviewed  COMPREHENSIVE METABOLIC PANEL - Abnormal; Notable for the following components:      Result Value   Glucose, Bld 129 (*)    BUN 21 (*)    All other components within normal limits  CBC - Abnormal; Notable for the following components:   Platelets 118 (*)    All other components within normal limits  URINALYSIS, ROUTINE W REFLEX MICROSCOPIC - Abnormal; Notable for the following components:   APPearance HAZY (*)  Hgb urine dipstick LARGE (*)    Protein, ur 30 (*)    Bacteria, UA RARE (*)    All other components within normal limits  LIPASE, BLOOD    EKG None  Radiology CT Renal Stone Study Result Date: 09/07/2023 CLINICAL DATA:  Acute left-sided abdominal pain. EXAM: CT ABDOMEN AND PELVIS WITHOUT CONTRAST TECHNIQUE: Multidetector CT imaging of the abdomen and pelvis was performed following the standard protocol without IV contrast. RADIATION DOSE REDUCTION: This exam was performed according to the departmental dose-optimization program which includes automated exposure control, adjustment of the mA and/or kV according to patient size and/or use of iterative reconstruction technique. COMPARISON:  None Available. FINDINGS: Lower chest: No acute abnormality. Hepatobiliary: No focal liver abnormality is seen. No gallstones, gallbladder  wall thickening, or biliary dilatation. Pancreas: Unremarkable. No pancreatic ductal dilatation or surrounding inflammatory changes. Spleen: Normal in size without focal abnormality. Adrenals/Urinary Tract: Adrenal glands are unremarkable. Minimal left hydronephrosis is noted secondary to 3 mm calculus at left ureterovesical junction. Bladder is unremarkable. Stomach/Bowel: Stomach is within normal limits. Appendix appears normal. No evidence of bowel wall thickening, distention, or inflammatory changes. Vascular/Lymphatic: No significant vascular findings are present. No enlarged abdominal or pelvic lymph nodes. Reproductive: Prostate is unremarkable. Other: No abdominal wall hernia or abnormality. No abdominopelvic ascites. Musculoskeletal: No acute or significant osseous findings. IMPRESSION: Minimal left hydronephrosis is noted secondary to 3 mm calculus at left ureterovesical junction. Electronically Signed   By: Lynwood Landy Raddle M.D.   On: 09/07/2023 18:06    Procedures Procedures    Medications Ordered in ED Medications  ketorolac  (TORADOL ) 15 MG/ML injection 15 mg (15 mg Intravenous Given 09/07/23 1803)    ED Course/ Medical Decision Making/ A&P                                 Medical Decision Making Amount and/or Complexity of Data Reviewed Labs: ordered. Radiology: ordered.  Risk Prescription drug management.   Roger Anderson is a 38 y.o. male who presents emergency department flank pain  Initial Ddx:  Nephrolithiasis, pyelonephritis, lumbar radiculopathy, AAA  MDM:  Feel the patient likely has nephrolithiasis based on their symptoms.  Will obtain urinalysis to assess for pyelonephritis as well but feel this is less likely given the lack of systemic symptoms.  Given age will also assess for AAA with CT scan. No symptoms that would be suggestive of radiculopathy.  Plan:  Labs Urinalysis CT stone protocol Toradol   ED Summary/Re-evaluation:  Urinalysis without signs of  urinary tract infection.  No AKI on lab work.  Patient's pain was able to be controlled and they were given a urine strainer and instructions on how to use it.  Will have them follow-up with urology for additional evaluation.  Was sent home with instructions to take Tylenol , ibuprofen, Flomax , and oxycodone  for any breakthrough pain.  This patient presents to the ED for concern of complaints listed in HPI, this involves an extensive number of treatment options, and is a complaint that carries with it a high risk of complications and morbidity. Disposition including potential need for admission considered.   Dispo: DC Home. Return precautions discussed including, but not limited to, those listed in the AVS. Allowed pt time to ask questions which were answered fully prior to dc.  Records reviewed Outpatient Clinic Notes The following labs were independently interpreted: Urinalysis and show  hematuria concerning for kidney stone I independently  reviewed the following imaging with scope of interpretation limited to determining acute life threatening conditions related to emergency care: CT Abdomen/Pelvis and agree with the radiologist interpretation with the following exceptions: none I have reviewed the patients home medications and made adjustments as needed   Final Clinical Impression(s) / ED Diagnoses Final diagnoses:  Kidney stone on left side    Rx / DC Orders ED Discharge Orders          Ordered    tamsulosin  (FLOMAX ) 0.4 MG CAPS capsule  Daily after breakfast        09/07/23 1825    oxyCODONE  (ROXICODONE ) 5 MG immediate release tablet  Every 4 hours PRN        09/07/23 1825              Yolande Lamar BROCKS, MD 09/07/23 1829

## 2023-10-31 ENCOUNTER — Other Ambulatory Visit: Payer: Self-pay

## 2023-10-31 ENCOUNTER — Emergency Department (HOSPITAL_COMMUNITY)

## 2023-10-31 ENCOUNTER — Encounter (HOSPITAL_COMMUNITY): Payer: Self-pay | Admitting: *Deleted

## 2023-10-31 ENCOUNTER — Emergency Department (HOSPITAL_COMMUNITY)
Admission: EM | Admit: 2023-10-31 | Discharge: 2023-10-31 | Disposition: A | Discharge status: Home / Self Care | Attending: Emergency Medicine | Admitting: Emergency Medicine

## 2023-10-31 DIAGNOSIS — N132 Hydronephrosis with renal and ureteral calculous obstruction: Secondary | ICD-10-CM | POA: Diagnosis not present

## 2023-10-31 DIAGNOSIS — R109 Unspecified abdominal pain: Secondary | ICD-10-CM | POA: Diagnosis present

## 2023-10-31 DIAGNOSIS — N200 Calculus of kidney: Secondary | ICD-10-CM

## 2023-10-31 HISTORY — DX: Calculus of kidney: N20.0

## 2023-10-31 LAB — CBC WITH DIFFERENTIAL/PLATELET
Abs Immature Granulocytes: 0.04 10*3/uL (ref 0.00–0.07)
Basophils Absolute: 0 10*3/uL (ref 0.0–0.1)
Basophils Relative: 0 %
Eosinophils Absolute: 0.2 10*3/uL (ref 0.0–0.5)
Eosinophils Relative: 3 %
HCT: 37.4 % — ABNORMAL LOW (ref 39.0–52.0)
Hemoglobin: 12.6 g/dL — ABNORMAL LOW (ref 13.0–17.0)
Immature Granulocytes: 1 %
Lymphocytes Relative: 4 %
Lymphs Abs: 0.3 10*3/uL — ABNORMAL LOW (ref 0.7–4.0)
MCH: 30.7 pg (ref 26.0–34.0)
MCHC: 33.7 g/dL (ref 30.0–36.0)
MCV: 91 fL (ref 80.0–100.0)
Monocytes Absolute: 0.6 10*3/uL (ref 0.1–1.0)
Monocytes Relative: 9 %
Neutro Abs: 5.6 10*3/uL (ref 1.7–7.7)
Neutrophils Relative %: 83 %
Platelets: 80 10*3/uL — ABNORMAL LOW (ref 150–400)
RBC: 4.11 MIL/uL — ABNORMAL LOW (ref 4.22–5.81)
RDW: 13 % (ref 11.5–15.5)
WBC: 6.7 10*3/uL (ref 4.0–10.5)
nRBC: 0 % (ref 0.0–0.2)

## 2023-10-31 LAB — COMPREHENSIVE METABOLIC PANEL
ALT: 23 U/L (ref 0–44)
AST: 27 U/L (ref 15–41)
Albumin: 4.4 g/dL (ref 3.5–5.0)
Alkaline Phosphatase: 68 U/L (ref 38–126)
Anion gap: 11 (ref 5–15)
BUN: 20 mg/dL (ref 6–20)
CO2: 26 mmol/L (ref 22–32)
Calcium: 9.2 mg/dL (ref 8.9–10.3)
Chloride: 101 mmol/L (ref 98–111)
Creatinine, Ser: 1.03 mg/dL (ref 0.61–1.24)
GFR, Estimated: >60 mL/min (ref >60)
Glucose, Bld: 140 mg/dL — ABNORMAL HIGH (ref 70–99)
Potassium: 3.4 mmol/L — ABNORMAL LOW (ref 3.5–5.1)
Sodium: 138 mmol/L (ref 135–145)
Total Bilirubin: 1.8 mg/dL — ABNORMAL HIGH (ref 0.0–1.2)
Total Protein: 6.8 g/dL (ref 6.5–8.1)

## 2023-10-31 LAB — URINALYSIS, ROUTINE W REFLEX MICROSCOPIC
Bacteria, UA: NONE SEEN
Bilirubin Urine: NEGATIVE
Glucose, UA: NEGATIVE mg/dL
Ketones, ur: NEGATIVE mg/dL
Leukocytes,Ua: NEGATIVE
Nitrite: NEGATIVE
Protein, ur: NEGATIVE mg/dL
Specific Gravity, Urine: >1.046 — ABNORMAL HIGH (ref 1.005–1.030)
pH: 7 (ref 5.0–8.0)

## 2023-10-31 LAB — MAGNESIUM: Magnesium: 1.9 mg/dL (ref 1.7–2.4)

## 2023-10-31 LAB — LIPASE, BLOOD: Lipase: 25 U/L (ref 11–51)

## 2023-10-31 MED ORDER — LACTATED RINGERS IV BOLUS
1000.0000 mL | Freq: Once | INTRAVENOUS | Status: AC
  Administered 2023-10-31: 1000 mL via INTRAVENOUS

## 2023-10-31 MED ORDER — TAMSULOSIN HCL 0.4 MG PO CAPS: 0.4000 mg | ORAL_CAPSULE | Freq: Every day | ORAL | 0 refills | Status: DC

## 2023-10-31 MED ORDER — ONDANSETRON 4 MG PO TBDP: 4.0000 mg | ORAL_TABLET | Freq: Three times a day (TID) | ORAL | 0 refills | Status: DC | PRN

## 2023-10-31 MED ORDER — IOHEXOL 300 MG/ML  SOLN
100.0000 mL | Freq: Once | INTRAMUSCULAR | Status: AC | PRN
  Administered 2023-10-31: 100 mL via INTRAVENOUS

## 2023-10-31 MED ORDER — TAMSULOSIN HCL 0.4 MG PO CAPS
0.4000 mg | ORAL_CAPSULE | Freq: Once | ORAL | Status: AC
  Administered 2023-10-31: 0.4 mg via ORAL
  Filled 2023-10-31: qty 1

## 2023-10-31 MED ORDER — OXYCODONE-ACETAMINOPHEN 5-325 MG PO TABS: 1.0000 | ORAL_TABLET | Freq: Three times a day (TID) | ORAL | 0 refills | Status: AC | PRN

## 2023-10-31 NOTE — ED Provider Notes (Signed)
 [place] [facility]ERG[facility] DEPARTMENT AT [facility] Provider Note   CSN: [id number] Arrival date & time: 10/31/23  1519     History  Chief Complaint  Patient presents with   Abdominal Pain    Roger Anderson is a 38 y.o. male.   Abdominal Pain Patient presents for abdominal pain.  He was seen 2 months ago for left sided abdominal pain.  At the time, he was found to have left-sided kidney stone.  He states that he has had intermittent pain to his left lower quadrant since that time.  Pain was worsened today.  This prompted him to come to the ED.  He states that he has been having normal bowel movements.  Pain has not migrated and does not radiate.  He denies any nausea.  He denies any pain at this time.  He states that pain is likely worsened with positional changes.     Home Medications Prior to Admission medications   Medication Sig Start Date End Date Taking? Authorizing Provider  ondansetron (ZOFRAN-ODT) 4 MG disintegrating tablet Take 1 tablet (4 mg total) by mouth every 8 (eight) hours as needed. 10/31/23  Yes Gloris Manchester, MD  oxyCODONE-acetaminophen (PERCOCET/ROXICET) 5-325 MG tablet Take 1 tablet by mouth every 8 (eight) hours as needed for up to 3 days for severe pain (pain score 7-10). 10/31/23 11/03/23 Yes Gloris Manchester, MD  tamsulosin (FLOMAX) 0.4 MG CAPS capsule Take 1 capsule (0.4 mg total) by mouth daily. 10/31/23  Yes Gloris Manchester, MD      Allergies    Patient has no known allergies.    Review of Systems   Review of Systems  Gastrointestinal:  Positive for abdominal pain.  All other systems reviewed and are negative.   Physical Exam Updated Vital Signs BP 128/88 (BP Location: Right Arm)   Pulse 68   Temp 98.2 F (36.8 C) (Oral)   Resp 18   Ht 5\' 10"  (1.778 m)   Wt 119.3 kg   SpO2 99%   BMI 37.74 kg/m  Physical Exam Vitals and nursing note reviewed.  Constitutional:      General: He is not in acute distress.    Appearance: He is well-developed. He is not  ill-appearing, toxic-appearing or diaphoretic.  HENT:     Head: Normocephalic and atraumatic.     Mouth/Throat:     Mouth: Mucous membranes are moist.  Eyes:     Extraocular Movements: Extraocular movements intact.     Conjunctiva/sclera: Conjunctivae normal.  Cardiovascular:     Rate and Rhythm: Normal rate and regular rhythm.  Pulmonary:     Effort: Pulmonary effort is normal. No respiratory distress.  Abdominal:     Palpations: Abdomen is soft.     Tenderness: There is no abdominal tenderness. There is no guarding or rebound.  Musculoskeletal:        General: No swelling.     Cervical back: Neck supple.  Skin:    General: Skin is warm and dry.  Neurological:     General: No focal deficit present.     Mental Status: He is alert and oriented to person, place, and time.  Psychiatric:        Mood and Affect: Mood normal.        Behavior: Behavior normal.     ED Results / Procedures / Treatments   Labs (all labs ordered are listed, but only abnormal results are displayed) Labs Reviewed  URINALYSIS, ROUTINE W REFLEX MICROSCOPIC - Abnormal; Notable  for the following components:      Result Value   Color, Urine STRAW (*)    Specific Gravity, Urine >1.046 (*)    Hgb urine dipstick MODERATE (*)    All other components within normal limits  COMPREHENSIVE METABOLIC PANEL - Abnormal; Notable for the following components:   Potassium 3.4 (*)    Glucose, Bld 140 (*)    Total Bilirubin 1.8 (*)    All other components within normal limits  CBC WITH DIFFERENTIAL/PLATELET - Abnormal; Notable for the following components:   RBC 4.11 (*)    Hemoglobin 12.6 (*)    HCT 37.4 (*)    Platelets 80 (*)    Lymphs Abs 0.3 (*)    All other components within normal limits  LIPASE, BLOOD  MAGNESIUM    EKG None  Radiology CT ABDOMEN PELVIS W CONTRAST Result Date: 10/31/2023 CLINICAL DATA:  Left lower quadrant pain EXAM: CT ABDOMEN AND PELVIS WITH CONTRAST TECHNIQUE: Multidetector CT  imaging of the abdomen and pelvis was performed using the standard protocol following bolus administration of intravenous contrast. RADIATION DOSE REDUCTION: This exam was performed according to the departmental dose-optimization program which includes automated exposure control, adjustment of the mA and/or kV according to patient size and/or use of iterative reconstruction technique. CONTRAST:  OMNIPAQUE IOHEXOL 300 MG/ML  SOLN COMPARISON:  CT 09/07/2023 FINDINGS: Lower chest: Lung bases are clear Hepatobiliary: No focal liver abnormality is seen. No gallstones, gallbladder wall thickening, or biliary dilatation. Pancreas: Unremarkable. No pancreatic ductal dilatation or surrounding inflammatory changes. Spleen: Normal in size without focal abnormality. Adrenals/Urinary Tract: Adrenal glands are normal. Mild left hydronephrosis and hydroureter, secondary to a 3 mm stone in the distal left ureter, several cm proximal to the left UVJ. Mild left perinephric stranding and mild hypoenhancement of left kidney. Stomach/Bowel: Stomach is within normal limits. Appendix appears normal. No evidence of bowel wall thickening, distention, or inflammatory changes. Vascular/Lymphatic: No significant vascular findings are present. No enlarged abdominal or pelvic lymph nodes. Reproductive: Prostate is unremarkable. Other: Negative for pelvic effusion or free air. Small fat containing left inguinal hernia Musculoskeletal: No acute or suspicious osseous abnormality IMPRESSION: 1. Mild left hydronephrosis and hydroureter, secondary to a 3 mm stone in the distal left ureter Electronically Signed   By: Jasmine Pang M.D.   On: 10/31/2023 17:58    Procedures Procedures    Medications Ordered in ED Medications  tamsulosin (FLOMAX) capsule 0.4 mg (has no administration in time range)  lactated ringers bolus 1,000 mL (0 mLs Intravenous Stopped 10/31/23 1901)  iohexol (OMNIPAQUE) 300 MG/ML solution 100 mL (100 mLs Intravenous  Contrast Given 10/31/23 1738)    ED Course/ Medical Decision Making/ A&P                                 Medical Decision Making Amount and/or Complexity of Data Reviewed Labs: ordered. Radiology: ordered.  Risk Prescription drug management.   This patient presents to the ED for concern of left lower quadrant abdominal pain, this involves an extensive number of treatment options, and is a complaint that carries with it a high risk of complications and morbidity.  The differential diagnosis includes colitis, diverticulitis, nephrolithiasis, UTI, constipation   Co morbidities that complicate the patient evaluation  Nephrolithiasis   Additional history obtained:  Additional history obtained from N/A External records from outside source obtained and reviewed including EMR   Lab Tests:  I Ordered, and personally interpreted labs.  The pertinent results include: Normal hemoglobin, no leukocytosis, thrombocytopenia of unknown etiology, mild hypokalemia with otherwise normal electrolytes, normal kidney function, scant hematuria without evidence of UTI.   Imaging Studies ordered:  I ordered imaging studies including CT of abdomen and pelvis I independently visualized and interpreted imaging which showed 3 mm stone in distal left ureter with mild upstream hydro I agree with the radiologist interpretation   Cardiac Monitoring: / EKG:  The patient was maintained on a cardiac monitor.  I personally viewed and interpreted the cardiac monitored which showed an underlying rhythm of: Sinus rhythm  Problem List / ED Course / Critical interventions / Medication management  Patient presents for left lower quadrant abdominal pain.  States that has been intermittent over the past 2 months.  Pain was worsened today which prompted him to come to the ED.  On arrival, vital signs are normal.  Patient is well-appearing on exam and currently denies any pain.  Lab work and CT imaging ordered.   Patient remained pain-free while in the ED.  His CT scan showed a 3 mm stone in distal left ureter.  Interestingly, this is the same size of the upper ureter stone that was identified 2 months ago.  It is unlikely that the 3 mm stone has been transiting his ureter for the past 2 months.  I suspect he has recurrence of nephrolithiasis.  Fortunately, he has only mild hydroureter and hydronephrosis.  He has no evidence of UTI.  Lab work was notable for thrombocytopenia.  This was present on his lab work from 2 months ago as well.  He denies alcohol use.  He was advised to follow-up with his primary care doctor for further lab work.  Patient was prescribed Flomax, Zofran, and pain control.  He was advised to stay well-hydrated and to follow-up with urology.  He was also advised to return for any worsening of symptoms.  Patient was discharged in stable condition. I ordered medication including IV fluids for duration; tamsulosin for nephrolithiasis Reevaluation of the patient after these medicines showed that the patient improved I have reviewed the patients home medicines and have made adjustments as needed   Social Determinants of Health:  Lives independently         Final Clinical Impression(s) / ED Diagnoses Final diagnoses:  Kidney stone    Rx / DC Orders ED Discharge Orders          Ordered    tamsulosin (FLOMAX) 0.4 MG CAPS capsule  Daily        10/31/23 2000    ondansetron (ZOFRAN-ODT) 4 MG disintegrating tablet  Every 8 hours PRN        10/31/23 2000    oxyCODONE-acetaminophen (PERCOCET/ROXICET) 5-325 MG tablet  Every 8 hours PRN        10/31/23 2000              Gloris Manchester, MD 10/31/23 2000

## 2023-10-31 NOTE — ED Notes (Signed)
Unable to obtain oral temp

## 2023-10-31 NOTE — ED Triage Notes (Signed)
 Pt with LLQ pain on and off for last few weeks, hx kidney stones back in Jan this year.  Denies any blood in urine or burning.  Denies any N/V/D

## 2023-10-31 NOTE — Discharge Instructions (Addendum)
 You have a small kidney stone in your left ureter.  This should pass on its own.  Take tamsulosin and drink plenty of fluids to help it pass.    Call the telephone number below in the morning to set up a urology follow-up appointment.  Return to the emergency department for any new or worsening symptoms of concern.    Additional prescriptions sent to your pharmacy are for pain and nausea.  Take these only as needed.    On your lab work, you do have low platelets.  This is similar to your ED visit 2 months ago.  You should follow-up with your primary care doctor about this.

## 2023-11-16 ENCOUNTER — Other Ambulatory Visit: Payer: Self-pay

## 2023-11-16 ENCOUNTER — Emergency Department (HOSPITAL_COMMUNITY)
Admission: EM | Admit: 2023-11-16 | Discharge: 2023-11-16 | Disposition: A | Discharge status: Home / Self Care | Attending: Emergency Medicine | Admitting: Emergency Medicine

## 2023-11-16 ENCOUNTER — Emergency Department (HOSPITAL_COMMUNITY)

## 2023-11-16 ENCOUNTER — Encounter (HOSPITAL_COMMUNITY): Payer: Self-pay | Admitting: *Deleted

## 2023-11-16 DIAGNOSIS — Z79899 Other long term (current) drug therapy: Secondary | ICD-10-CM | POA: Diagnosis not present

## 2023-11-16 DIAGNOSIS — N23 Unspecified renal colic: Secondary | ICD-10-CM

## 2023-11-16 DIAGNOSIS — R109 Unspecified abdominal pain: Secondary | ICD-10-CM | POA: Diagnosis present

## 2023-11-16 DIAGNOSIS — N132 Hydronephrosis with renal and ureteral calculous obstruction: Secondary | ICD-10-CM | POA: Insufficient documentation

## 2023-11-16 LAB — CBC WITH DIFFERENTIAL/PLATELET
Abs Immature Granulocytes: 0.05 10*3/uL (ref 0.00–0.07)
Basophils Absolute: 0 10*3/uL (ref 0.0–0.1)
Basophils Relative: 0 %
Eosinophils Absolute: 0 10*3/uL (ref 0.0–0.5)
Eosinophils Relative: 0 %
HCT: 36.2 % — ABNORMAL LOW (ref 39.0–52.0)
Hemoglobin: 12.3 g/dL — ABNORMAL LOW (ref 13.0–17.0)
Immature Granulocytes: 1 %
Lymphocytes Relative: 2 %
Lymphs Abs: 0.2 10*3/uL — ABNORMAL LOW (ref 0.7–4.0)
MCH: 31.1 pg (ref 26.0–34.0)
MCHC: 34 g/dL (ref 30.0–36.0)
MCV: 91.6 fL (ref 80.0–100.0)
Monocytes Absolute: 0.6 10*3/uL (ref 0.1–1.0)
Monocytes Relative: 6 %
Neutro Abs: 8.6 10*3/uL — ABNORMAL HIGH (ref 1.7–7.7)
Neutrophils Relative %: 91 %
Platelets: 109 10*3/uL — ABNORMAL LOW (ref 150–400)
RBC: 3.95 MIL/uL — ABNORMAL LOW (ref 4.22–5.81)
RDW: 13.2 % (ref 11.5–15.5)
WBC: 9.6 10*3/uL (ref 4.0–10.5)
nRBC: 0 % (ref 0.0–0.2)

## 2023-11-16 LAB — URINALYSIS, ROUTINE W REFLEX MICROSCOPIC
Bacteria, UA: NONE SEEN
Bilirubin Urine: NEGATIVE
Glucose, UA: 50 mg/dL — AB
Ketones, ur: 20 mg/dL — AB
Leukocytes,Ua: NEGATIVE
Nitrite: NEGATIVE
Protein, ur: 30 mg/dL — AB
RBC / HPF: >50 RBC/hpf (ref 0–5)
Specific Gravity, Urine: 1.014 (ref 1.005–1.030)
pH: 6 (ref 5.0–8.0)

## 2023-11-16 LAB — COMPREHENSIVE METABOLIC PANEL
ALT: 22 U/L (ref 0–44)
AST: 28 U/L (ref 15–41)
Albumin: 4.7 g/dL (ref 3.5–5.0)
Alkaline Phosphatase: 80 U/L (ref 38–126)
Anion gap: 11 (ref 5–15)
BUN: 20 mg/dL (ref 6–20)
CO2: 25 mmol/L (ref 22–32)
Calcium: 9.4 mg/dL (ref 8.9–10.3)
Chloride: 100 mmol/L (ref 98–111)
Creatinine, Ser: 1.14 mg/dL (ref 0.61–1.24)
GFR, Estimated: >60 mL/min (ref >60)
Glucose, Bld: 140 mg/dL — ABNORMAL HIGH (ref 70–99)
Potassium: 3.4 mmol/L — ABNORMAL LOW (ref 3.5–5.1)
Sodium: 136 mmol/L (ref 135–145)
Total Bilirubin: 2.3 mg/dL — ABNORMAL HIGH (ref 0.0–1.2)
Total Protein: 7.2 g/dL (ref 6.5–8.1)

## 2023-11-16 LAB — LIPASE, BLOOD: Lipase: 22 U/L (ref 11–51)

## 2023-11-16 MED ORDER — ONDANSETRON HCL 4 MG/2ML IJ SOLN
4.0000 mg | Freq: Once | INTRAMUSCULAR | Status: AC
  Administered 2023-11-16: 4 mg via INTRAVENOUS
  Filled 2023-11-16: qty 2

## 2023-11-16 MED ORDER — OXYCODONE HCL 5 MG PO TABS
5.0000 mg | ORAL_TABLET | Freq: Once | ORAL | Status: AC
  Administered 2023-11-16: 5 mg via ORAL
  Filled 2023-11-16: qty 1

## 2023-11-16 MED ORDER — TAMSULOSIN HCL 0.4 MG PO CAPS: 0.4000 mg | ORAL_CAPSULE | Freq: Every day | ORAL | 0 refills | Status: AC

## 2023-11-16 MED ORDER — KETOROLAC TROMETHAMINE 15 MG/ML IJ SOLN
15.0000 mg | Freq: Once | INTRAMUSCULAR | Status: AC
  Administered 2023-11-16: 15 mg via INTRAVENOUS
  Filled 2023-11-16: qty 1

## 2023-11-16 MED ORDER — OXYCODONE HCL 5 MG PO TABS: 5.0000 mg | ORAL_TABLET | Freq: Four times a day (QID) | ORAL | 0 refills | Status: AC | PRN

## 2023-11-16 MED ORDER — KETOROLAC TROMETHAMINE 10 MG PO TABS: 10.0000 mg | ORAL_TABLET | Freq: Four times a day (QID) | ORAL | 0 refills | Status: AC | PRN

## 2023-11-16 MED ORDER — ACETAMINOPHEN 500 MG PO TABS
1000.0000 mg | ORAL_TABLET | Freq: Once | ORAL | Status: AC
  Administered 2023-11-16: 1000 mg via ORAL
  Filled 2023-11-16: qty 2

## 2023-11-16 MED ORDER — ONDANSETRON 4 MG PO TBDP: 4.0000 mg | ORAL_TABLET | Freq: Four times a day (QID) | ORAL | 0 refills | Status: AC | PRN

## 2023-11-16 MED ORDER — SODIUM CHLORIDE 0.9 % IV BOLUS
1000.0000 mL | Freq: Once | INTRAVENOUS | Status: AC
  Administered 2023-11-16: 1000 mL via INTRAVENOUS

## 2023-11-16 NOTE — ED Provider Notes (Signed)
 [place] EMERGENCY DEPARTMENT AT [facility] Provider Note   CSN: [id number] Arrival date & time: 11/16/23  1341     History  Chief Complaint  Patient presents with   Abdominal Pain    Roger Anderson is a 38 y.o. male.  Plan of left flank pain and lower abdominal pain that feels like kidney stone pain.  This has been going on intermittently for the past couple of months.  He was seen on January 7 and March 2 and found both times to have a distal 3 mm ureteral stone with mild hydronephrosis.  Patient states he took 2 of his oxycodone home today prior to arrival without relief of pain so presented to the ER.  Denies fever or chills, denies dysuria or frank blood in his urine.  He has not been able to follow-up with urology as he states he has been having trouble getting somebody to cover him for work.  He is not having any vomiting or diarrhea   Abdominal Pain      Home Medications Prior to Admission medications   Medication Sig Start Date End Date Taking? Authorizing Provider  ketorolac (TORADOL) 10 MG tablet Take 1 tablet (10 mg total) by mouth every 6 (six) hours as needed. 11/16/23  Yes Lyris Hitchman A, PA-C  ondansetron (ZOFRAN-ODT) 4 MG disintegrating tablet Take 1 tablet (4 mg total) by mouth every 6 (six) hours as needed for nausea or vomiting. 11/16/23  Yes Tyreck Bell A, PA-C  oxyCODONE (ROXICODONE) 5 MG immediate release tablet Take 1 tablet (5 mg total) by mouth every 6 (six) hours as needed for severe pain (pain score 7-10). 11/16/23  Yes Jarita Raval A, PA-C  tamsulosin (FLOMAX) 0.4 MG CAPS capsule Take 1 capsule (0.4 mg total) by mouth daily. 11/16/23   Carmel Sacramento A, PA-C      Allergies    Patient has no known allergies.    Review of Systems   Review of Systems  Gastrointestinal:  Positive for abdominal pain.    Physical Exam Updated Vital Signs BP 139/80   Pulse (!) 58   Temp 97.7 F (36.5 C) (Oral)   Resp 16   Ht 5\' 10"  (1.778 m)    Wt 120.2 kg   SpO2 94%   BMI 38.02 kg/m  Physical Exam  ED Results / Procedures / Treatments   Labs (all labs ordered are listed, but only abnormal results are displayed) Labs Reviewed  CBC WITH DIFFERENTIAL/PLATELET - Abnormal; Notable for the following components:      Result Value   RBC 3.95 (*)    Hemoglobin 12.3 (*)    HCT 36.2 (*)    Platelets 109 (*)    Neutro Abs 8.6 (*)    Lymphs Abs 0.2 (*)    All other components within normal limits  COMPREHENSIVE METABOLIC PANEL - Abnormal; Notable for the following components:   Potassium 3.4 (*)    Glucose, Bld 140 (*)    Total Bilirubin 2.3 (*)    All other components within normal limits  URINALYSIS, ROUTINE W REFLEX MICROSCOPIC - Abnormal; Notable for the following components:   Glucose, UA 50 (*)    Hgb urine dipstick LARGE (*)    Ketones, ur 20 (*)    Protein, ur 30 (*)    All other components within normal limits  LIPASE, BLOOD    EKG None  Radiology US Renal Result Date: 11/16/2023 CLINICAL DATA:  Left flank pain, history of the left  ureteral calculus EXAM: RENAL / URINARY TRACT ULTRASOUND COMPLETE COMPARISON:  10/31/2023 FINDINGS: Right Kidney: Renal measurements: 10.8 x 5.8 x 5.9 cm = volume: 192.4 mL. Echogenicity within normal limits. No mass or hydronephrosis visualized. No evidence of nephrolithiasis. Left Kidney: Renal measurements: 11.8 x 6.3 x 6.8 cm = volume: 262.4 mL. Echogenicity within normal limits. Mild left hydronephrosis and proximal left hydroureter, similar to prior CT. No evidence of nephrolithiasis. No renal mass. Bladder: Appears normal for degree of bladder distention. Bilateral ureteral jets are identified. Other: None. IMPRESSION: 1. Mild left hydronephrosis and proximal left hydroureter, similar to prior CT. The distal left ureteral obstructing calculus seen on prior CT is not visualized on ultrasound. 2. Unremarkable right kidney. Electronically Signed   By: Sharlet Salina M.D.   On: 11/16/2023  16:56    Procedures Procedures    Medications Ordered in ED Medications  ketorolac (TORADOL) 15 MG/ML injection 15 mg (15 mg Intravenous Given 11/16/23 1448)  ondansetron (ZOFRAN) injection 4 mg (4 mg Intravenous Given 11/16/23 1448)  sodium chloride 0.9 % bolus 1,000 mL (0 mLs Intravenous Stopped 11/16/23 1846)  oxyCODONE (Oxy IR/ROXICODONE) immediate release tablet 5 mg (5 mg Oral Given 11/16/23 1721)  acetaminophen (TYLENOL) tablet 1,000 mg (1,000 mg Oral Given 11/16/23 1721)    ED Course/ Medical Decision Making/ A&P                                 Medical Decision Making DDx: Ureteral colic, UTI, sepsis, muscle strain, HNP, other  ED course: Patient here for left flank pain that feels like prior kidney stone, this has been intermittent issue since January, has had 2 CT scan showing a left distal ureteral stone with mild hydronephrosis.  Ultrasound today shows mild hydronephrosis on the left again but stone not seen within the ureter, discussed with patient likely the same stone is causing the problem.  He is not febrile, his renal function is normal, vitals are normal.  He does not have UTI.  Pain was well-controlled the ED.  He came in in obvious discomfort, had significant relief with IV Toradol, pain started to recur and Tylenol and oxycodone resolved his symptoms.  He is tolerating p.o. at this time.  Discussed the need for outpatient urology follow-up.  He states he should be able to follow-up and understands the importance.  He was given strict return precautions.  Amount and/or Complexity of Data Reviewed External Data Reviewed: labs and notes.    Details: No UTI, normal renal function, hematuria noted on urinalysis Labs: ordered. Radiology: ordered.  Risk OTC drugs. Prescription drug management.           Final Clinical Impression(s) / ED Diagnoses Final diagnoses:  Renal colic on left side    Rx / DC Orders ED Discharge Orders          Ordered     tamsulosin (FLOMAX) 0.4 MG CAPS capsule  Daily        11/16/23 1910    ketorolac (TORADOL) 10 MG tablet  Every 6 hours PRN        11/16/23 1911    ondansetron (ZOFRAN-ODT) 4 MG disintegrating tablet  Every 6 hours PRN        11/16/23 1912    oxyCODONE (ROXICODONE) 5 MG immediate release tablet  Every 6 hours PRN        11/16/23 1912  Josem Kaufmann 11/16/23 1917    Cathren Laine, MD 11/17/23 2116

## 2023-11-16 NOTE — ED Notes (Signed)
Attempted oral temp x 2

## 2023-11-16 NOTE — ED Triage Notes (Addendum)
 Pt in c/o L abd pain onset today, pt denies n/v/d, denies fever, pt reports hx of kidney stones in March, denies urology symptoms at this time, states, "I have not peed today."  A&O x4. Unable to assess oral temp, pt reports he is homeless and has been outside

## 2023-11-16 NOTE — Discharge Instructions (Addendum)
 It was a pleasure taking care of you today.  You were evaluated in the ER for left side pain due to kidney stone.  Your results showed no signs of UTI, normal kidney function, your ultrasound showed there is still some fluid around your kidney suggestive that the kidney stone has not passed yet.  You are being discharged with medication to help with pain as well as nausea medicine Flomax.  It is very important for you to follow-up with the urologist.  Dr. Alvester Morin was the urologist on-call today, he is located in [facility], he can also try to follow-up here locally in [place], you are given Dr. Dimas Millin number.  Come back to the ER if you have new or worsening symptoms. The ketorolac you are prescribed is an anti-inflammatory.  Do not take it with ibuprofen, Aleve or aspirin.  Make sure you take it with food, it can cause stomach irritation.

## 2023-11-16 NOTE — ED Notes (Signed)
 Urinal at bedside for UA, Patient is aware.

## 2024-09-08 ENCOUNTER — Emergency Department (HOSPITAL_BASED_OUTPATIENT_CLINIC_OR_DEPARTMENT_OTHER)
Admission: EM | Admit: 2024-09-08 | Discharge: 2024-09-08 | Disposition: A | Discharge status: Home / Self Care | Attending: Emergency Medicine | Admitting: Emergency Medicine

## 2024-09-08 ENCOUNTER — Emergency Department (HOSPITAL_BASED_OUTPATIENT_CLINIC_OR_DEPARTMENT_OTHER): Admitting: Radiology

## 2024-09-08 ENCOUNTER — Encounter (HOSPITAL_BASED_OUTPATIENT_CLINIC_OR_DEPARTMENT_OTHER): Payer: Self-pay | Admitting: Emergency Medicine

## 2024-09-08 ENCOUNTER — Other Ambulatory Visit: Payer: Self-pay

## 2024-09-08 DIAGNOSIS — R0789 Other chest pain: Secondary | ICD-10-CM | POA: Diagnosis present

## 2024-09-08 MED ORDER — LIDOCAINE 5 % EX PTCH
1.0000 | MEDICATED_PATCH | Freq: Once | CUTANEOUS | Status: DC
  Administered 2024-09-08: 1 via TRANSDERMAL
  Filled 2024-09-08: qty 1

## 2024-09-08 MED ORDER — OXYCODONE HCL 5 MG PO TABS
5.0000 mg | ORAL_TABLET | Freq: Once | ORAL | Status: AC
  Administered 2024-09-08: 5 mg via ORAL
  Filled 2024-09-08: qty 1

## 2024-09-08 MED ORDER — ACETAMINOPHEN 500 MG PO TABS
1000.0000 mg | ORAL_TABLET | Freq: Once | ORAL | Status: AC
  Administered 2024-09-08: 1000 mg via ORAL
  Filled 2024-09-08: qty 2

## 2024-09-08 MED ORDER — KETOROLAC TROMETHAMINE 15 MG/ML IJ SOLN
15.0000 mg | Freq: Once | INTRAMUSCULAR | Status: AC
  Administered 2024-09-08: 15 mg via INTRAMUSCULAR
  Filled 2024-09-08: qty 1

## 2024-09-08 MED ORDER — CYCLOBENZAPRINE HCL 10 MG PO TABS: 10.0000 mg | ORAL_TABLET | Freq: Two times a day (BID) | ORAL | 0 refills | Status: AC | PRN

## 2024-09-08 NOTE — ED Triage Notes (Signed)
 Pt c/o LT side rib pain, reports child was jumping on me x 2 weeks pta. Pain worsened after sneezing this am. 1600 mg ibuprofen at 630

## 2024-09-08 NOTE — Discharge Instructions (Signed)
 Roger Anderson  Thank you for allowing us  to take care of you today.  You came to the Emergency Department today because you were having pain in your left ribs, your son recently jumped on this area of your chest and your pain got worse after you had a forceful sneeze this morning.  Here in the emergency department we did an x-ray that shows that you do not have any broken ribs out of place or secondary complications such as air or fluid around your lung.  It is possible that you could have a broken rib that is not out of place as these are very difficult to see on chest x-ray sometimes, however there is no specific treatment for broken rib that is not out of place, the treatment is the same with Tylenol , ibuprofen, over-the-counter lidocaine  patches, and muscle relaxers twice a day as needed for pain, we recommend following up with your primary care doctor to make sure that you are progressing appropriately.  Flexeril , the muscle relaxer can make people sleepy, we recommend not driving or operating heavy machinery after taking this medication in case it makes you sleepy.   To-Do: 1. Please follow-up with your primary doctor within 1 - 2 weeks / as soon as possible.   Please return to the Emergency Department or call 911 if you experience have worsening of your symptoms, or do not get better, new or different chest pain, shortness of breath, severe or significantly worsening pain, high fever, severe confusion, pass out or have any reason to think that you need emergency medical care.   We hope you feel better soon.   Mitzie Later, MD Department of Emergency Medicine [facility]

## 2024-09-08 NOTE — ED Provider Notes (Signed)
 " [place] [facility] AT [facility] Provider Note   CSN: [id number] Arrival date & time: 09/08/24  0725     History Chief Complaint  Patient presents with   Rib Injury    HPI: Roger Anderson is a 39 y.o. male with no pertinent history who presents complaining of left-sided chest wall pain. Patient arrived via POV.  History provided by patient.  No interpreter required during this encounter.  Patient reports that he was playing with his 81-year-old son approximately 2 weeks ago and his son was jumping on his left-sided chest wall.  Reports that he has had pain in his left-sided chest wall since that time, had not been taking anything for it at home until this morning when he had a large sneeze and had worsening of his chest wall pain.  Reports that he took ibuprofen at home which took the edge off, however he still has ongoing pain.  Denies any fever, chills, shortness of breath, chest pressure, substernal chest pain, nausea, vomiting, diarrhea.  Reports that he is concerned for a rib fracture and specifically came to have a chest x-ray performed.  Patient's recorded medical, surgical, social, medication list and allergies were reviewed in the Snapshot window as part of the initial history.   Prior to Admission medications  Medication Sig Start Date End Date Taking? Authorizing Provider  cyclobenzaprine  (FLEXERIL ) 10 MG tablet Take 1 tablet (10 mg total) by mouth 2 (two) times daily as needed for muscle spasms. 09/08/24  Yes Rogelia Jerilynn RAMAN, MD  ketorolac  (TORADOL ) 10 MG tablet Take 1 tablet (10 mg total) by mouth every 6 (six) hours as needed. 11/16/23   Suellen Cantor A, PA-C  ondansetron  (ZOFRAN -ODT) 4 MG disintegrating tablet Take 1 tablet (4 mg total) by mouth every 6 (six) hours as needed for nausea or vomiting. 11/16/23   Suellen Cantor A, PA-C  oxyCODONE  (ROXICODONE ) 5 MG immediate release tablet Take 1 tablet (5 mg total) by mouth every 6 (six) hours as needed  for severe pain (pain score 7-10). 11/16/23   Suellen Cantor A, PA-C  tamsulosin  (FLOMAX ) 0.4 MG CAPS capsule Take 1 capsule (0.4 mg total) by mouth daily. 11/16/23   Suellen Cantor A, PA-C     Allergies: Patient has no known allergies.   Review of Systems   ROS as per HPI  Physical Exam Updated Vital Signs BP 130/85 (BP Location: Right Arm)   Pulse 77   Temp (!) 97.5 F (36.4 C) (Oral)   Resp 14   Wt 117.9 kg   SpO2 99%   BMI 37.31 kg/m  Physical Exam Vitals and nursing note reviewed.  Constitutional:      General: He is not in acute distress.    Appearance: He is well-developed.  HENT:     Head: Normocephalic and atraumatic.  Eyes:     Conjunctiva/sclera: Conjunctivae normal.  Cardiovascular:     Rate and Rhythm: Normal rate and regular rhythm.     Heart sounds: No murmur heard. Pulmonary:     Effort: Pulmonary effort is normal. No respiratory distress.     Breath sounds: Normal breath sounds.  Abdominal:     Palpations: Abdomen is soft.     Tenderness: There is no abdominal tenderness.  Musculoskeletal:        General: No swelling.     Cervical back: Neck supple.     Comments: Chest wall stable to AP and lateral compression, tender on the left lateral chest wall aspect  Skin:    General: Skin is warm and dry.     Capillary Refill: Capillary refill takes less than 2 seconds.  Neurological:     Mental Status: He is alert.  Psychiatric:        Mood and Affect: Mood normal.     ED Course/ Medical Decision Making/ A&P    Procedures Procedures   Medications Ordered in ED Medications  lidocaine  (LIDODERM ) 5 % 1 patch (1 patch Transdermal Patch Applied 09/08/24 0812)  oxyCODONE  (Oxy IR/ROXICODONE ) immediate release tablet 5 mg (5 mg Oral Given 09/08/24 0812)  ketorolac  (TORADOL ) 15 MG/ML injection 15 mg (15 mg Intramuscular Given 09/08/24 0813)  acetaminophen  (TYLENOL ) tablet 1,000 mg (1,000 mg Oral Given 09/08/24 9187)    Medical Decision Making:   DEMICHAEL TRAUM  is a 39 y.o. male who presents for left lateral chest wall pain after recent trauma as per above.  Physical exam is pertinent for left lateral chest wall tenderness to palpation without instability.   The differential includes but is not limited to contusion, muscular strain, nondisplaced rib fracture, displaced rib fracture, pneumothorax, hemothorax.  Independent historian: None  External data reviewed: No pertinent external data  Initial Plan:  Rib series x-rays to evaluate for displaced rib fracture or secondary complication such as pneumothorax, hemothorax Objective evaluation as below reviewed   Labs: Not indicated  Radiology: Ordered, Independent interpretation, Details: Chest x-ray without focal airspace opacification, cardiomediastinal silhouette derangement, pneumothorax, pleural effusion, displaced rib fracture or other bony derangement, and All images reviewed independently.  Agree with radiology report at this time.   DG Ribs Unilateral W/Chest Left Result Date: 09/08/2024 CLINICAL DATA:  Left rib pain after injury EXAM: LEFT RIBS AND CHEST - 3+ VIEW COMPARISON:  May 10, 2008 FINDINGS: No fracture or other bone lesions are seen involving the ribs. There is no evidence of pneumothorax or pleural effusion. Both lungs are clear. Heart size and mediastinal contours are within normal limits. IMPRESSION: Negative. Electronically Signed   By: Lynwood Landy Raddle M.D.   On: 09/08/2024 08:26    EKG/Medicine tests: Not indicated EKG Interpretation:                  Interventions: Lidocaine  patch, Toradol , Tylenol , oxycodone   See the EMR for full details regarding lab and imaging results.  Patient presents to the emergency department for lateral chest wall pain and tenderness to palpation after recent mechanical trauma, discussed that we can evaluate for displaced rib fractures and secondary complications such as hemopneumothorax, however nondisplaced rib fractures are not always  evident on chest x-ray.  Patient expressed understanding.  Will treat with multimodal pain therapy and obtain rib series.  This revealed no displaced rib fracture or secondary complication, on reevaluation, patient reports improvement in pain, will discharge with multimodal pain therapy recommendations as well as prescription for muscle relaxer, patient comfortable with this plan.  Presentation is most consistent with acute complicated illness and I did consider and rule out acute life/limb-threatening illness  Discussion of management or test interpretations with external provider(s): Not indicated  Risk Drugs:OTC drugs and Prescription drug management  Disposition: DISCHARGE: I believe that the patient is safe for discharge home with outpatient follow-up. Patient was informed of all pertinent physical exam, laboratory, and imaging findings. Patient's suspected etiology of their symptom presentation was discussed with the patient and all questions were answered. We discussed following up with PCP. I provided thorough ED return precautions. The patient feels safe and comfortable with this  plan.  MDM generated using voice dictation software and may contain dictation errors.  Please contact me for any clarification or with any questions.  Clinical Impression:  1. Rib pain      Discharge   Final Clinical Impression(s) / ED Diagnoses Final diagnoses:  Rib pain    Rx / DC Orders ED Discharge Orders          Ordered    cyclobenzaprine  (FLEXERIL ) 10 MG tablet  2 times daily PRN        09/08/24 0930             Rogelia Jerilynn RAMAN, MD 09/08/24 0930  "
